# Patient Record
Sex: Male | Born: 1935 | Race: White | Hispanic: No
Health system: Southern US, Community
[De-identification: ages and names within clinical notes are randomized; demographics above are authoritative.]

## PROBLEM LIST (undated history)

## (undated) DIAGNOSIS — I10 Essential (primary) hypertension: Secondary | ICD-10-CM

## (undated) DIAGNOSIS — E119 Type 2 diabetes mellitus without complications: Secondary | ICD-10-CM

## (undated) DIAGNOSIS — E669 Obesity, unspecified: Secondary | ICD-10-CM

## (undated) DIAGNOSIS — E785 Hyperlipidemia, unspecified: Secondary | ICD-10-CM

---

## 2019-09-06 ENCOUNTER — Inpatient Hospital Stay (HOSPITAL_COMMUNITY)
Admission: EM | Admit: 2019-09-06 | Discharge: 2019-09-11 | DRG: 177 | Disposition: A | Discharge status: Home Health | Payer: Medicare HMO | Source: Other Acute Inpatient Hospital | Attending: Internal Medicine | Admitting: Internal Medicine

## 2019-09-06 ENCOUNTER — Encounter (HOSPITAL_COMMUNITY): Payer: Self-pay | Admitting: Internal Medicine

## 2019-09-06 DIAGNOSIS — I714 Abdominal aortic aneurysm, without rupture, unspecified: Secondary | ICD-10-CM | POA: Diagnosis present

## 2019-09-06 DIAGNOSIS — E1169 Type 2 diabetes mellitus with other specified complication: Secondary | ICD-10-CM | POA: Diagnosis present

## 2019-09-06 DIAGNOSIS — N179 Acute kidney failure, unspecified: Secondary | ICD-10-CM | POA: Diagnosis present

## 2019-09-06 DIAGNOSIS — K922 Gastrointestinal hemorrhage, unspecified: Secondary | ICD-10-CM | POA: Diagnosis present

## 2019-09-06 DIAGNOSIS — Z7984 Long term (current) use of oral hypoglycemic drugs: Secondary | ICD-10-CM | POA: Diagnosis not present

## 2019-09-06 DIAGNOSIS — J1282 Pneumonia due to coronavirus disease 2019: Secondary | ICD-10-CM | POA: Diagnosis present

## 2019-09-06 DIAGNOSIS — J440 Chronic obstructive pulmonary disease with acute lower respiratory infection: Secondary | ICD-10-CM | POA: Diagnosis not present

## 2019-09-06 DIAGNOSIS — D62 Acute posthemorrhagic anemia: Secondary | ICD-10-CM | POA: Diagnosis present

## 2019-09-06 DIAGNOSIS — E785 Hyperlipidemia, unspecified: Secondary | ICD-10-CM | POA: Diagnosis present

## 2019-09-06 DIAGNOSIS — I700 Atherosclerosis of aorta: Secondary | ICD-10-CM | POA: Diagnosis present

## 2019-09-06 DIAGNOSIS — I10 Essential (primary) hypertension: Secondary | ICD-10-CM | POA: Diagnosis present

## 2019-09-06 DIAGNOSIS — Z79899 Other long term (current) drug therapy: Secondary | ICD-10-CM

## 2019-09-06 DIAGNOSIS — H548 Legal blindness, as defined in USA: Secondary | ICD-10-CM | POA: Diagnosis present

## 2019-09-06 DIAGNOSIS — R45851 Suicidal ideations: Secondary | ICD-10-CM | POA: Diagnosis not present

## 2019-09-06 DIAGNOSIS — U071 COVID-19: Principal | ICD-10-CM | POA: Diagnosis present

## 2019-09-06 DIAGNOSIS — E669 Obesity, unspecified: Secondary | ICD-10-CM | POA: Diagnosis present

## 2019-09-06 DIAGNOSIS — E119 Type 2 diabetes mellitus without complications: Secondary | ICD-10-CM | POA: Diagnosis present

## 2019-09-06 DIAGNOSIS — Z7982 Long term (current) use of aspirin: Secondary | ICD-10-CM

## 2019-09-06 DIAGNOSIS — Z66 Do not resuscitate: Secondary | ICD-10-CM | POA: Diagnosis present

## 2019-09-06 DIAGNOSIS — J9601 Acute respiratory failure with hypoxia: Secondary | ICD-10-CM | POA: Diagnosis present

## 2019-09-06 DIAGNOSIS — G4700 Insomnia, unspecified: Secondary | ICD-10-CM | POA: Diagnosis present

## 2019-09-06 DIAGNOSIS — Z833 Family history of diabetes mellitus: Secondary | ICD-10-CM | POA: Diagnosis not present

## 2019-09-06 HISTORY — DX: Obesity, unspecified: E66.9

## 2019-09-06 HISTORY — DX: Hyperlipidemia, unspecified: E78.5

## 2019-09-06 HISTORY — DX: Essential (primary) hypertension: I10

## 2019-09-06 HISTORY — DX: Type 2 diabetes mellitus without complications: E11.9

## 2019-09-06 LAB — CBC WITH DIFFERENTIAL/PLATELET
Abs Immature Granulocytes: 0.04 10*3/uL (ref 0.00–0.07)
Basophils Absolute: 0 10*3/uL (ref 0.0–0.1)
Basophils Relative: 0 %
Eosinophils Absolute: 0 10*3/uL (ref 0.0–0.5)
Eosinophils Relative: 0 %
HCT: 34.2 % — ABNORMAL LOW (ref 39.0–52.0)
Hemoglobin: 11.6 g/dL — ABNORMAL LOW (ref 13.0–17.0)
Immature Granulocytes: 1 %
Lymphocytes Relative: 9 %
Lymphs Abs: 0.6 10*3/uL — ABNORMAL LOW (ref 0.7–4.0)
MCH: 32.2 pg (ref 26.0–34.0)
MCHC: 33.9 g/dL (ref 30.0–36.0)
MCV: 95 fL (ref 80.0–100.0)
Monocytes Absolute: 0.2 10*3/uL (ref 0.1–1.0)
Monocytes Relative: 4 %
Neutro Abs: 5.2 10*3/uL (ref 1.7–7.7)
Neutrophils Relative %: 86 %
Platelets: 124 10*3/uL — ABNORMAL LOW (ref 150–400)
RBC: 3.6 MIL/uL — ABNORMAL LOW (ref 4.22–5.81)
RDW: 15.4 % (ref 11.5–15.5)
WBC: 6.1 10*3/uL (ref 4.0–10.5)
nRBC: 0 % (ref 0.0–0.2)

## 2019-09-06 LAB — C-REACTIVE PROTEIN: CRP: 11.6 mg/dL — ABNORMAL HIGH (ref <1.0)

## 2019-09-06 LAB — HEMOGLOBIN AND HEMATOCRIT, BLOOD
HCT: 32.9 % — ABNORMAL LOW (ref 39.0–52.0)
Hemoglobin: 11.4 g/dL — ABNORMAL LOW (ref 13.0–17.0)

## 2019-09-06 LAB — COMPREHENSIVE METABOLIC PANEL
ALT: 25 U/L (ref 0–44)
AST: 67 U/L — ABNORMAL HIGH (ref 15–41)
Albumin: 2.3 g/dL — ABNORMAL LOW (ref 3.5–5.0)
Alkaline Phosphatase: 49 U/L (ref 38–126)
Anion gap: 12 (ref 5–15)
BUN: 33 mg/dL — ABNORMAL HIGH (ref 8–23)
CO2: 19 mmol/L — ABNORMAL LOW (ref 22–32)
Calcium: 7.4 mg/dL — ABNORMAL LOW (ref 8.9–10.3)
Chloride: 106 mmol/L (ref 98–111)
Creatinine, Ser: 1.65 mg/dL — ABNORMAL HIGH (ref 0.61–1.24)
GFR calc Af Amer: 44 mL/min — ABNORMAL LOW (ref >60)
GFR calc non Af Amer: 38 mL/min — ABNORMAL LOW (ref >60)
Glucose, Bld: 140 mg/dL — ABNORMAL HIGH (ref 70–99)
Potassium: 3.3 mmol/L — ABNORMAL LOW (ref 3.5–5.1)
Sodium: 137 mmol/L (ref 135–145)
Total Bilirubin: 0.6 mg/dL (ref 0.3–1.2)
Total Protein: 5.5 g/dL — ABNORMAL LOW (ref 6.5–8.1)

## 2019-09-06 LAB — FERRITIN: Ferritin: 1347 ng/mL — ABNORMAL HIGH (ref 24–336)

## 2019-09-06 LAB — TYPE AND SCREEN
ABO/RH(D): A NEG
Antibody Screen: NEGATIVE

## 2019-09-06 LAB — ABO/RH: ABO/RH(D): A NEG

## 2019-09-06 LAB — MAGNESIUM: Magnesium: 1.1 mg/dL — ABNORMAL LOW (ref 1.7–2.4)

## 2019-09-06 LAB — PHOSPHORUS: Phosphorus: 2.7 mg/dL (ref 2.5–4.6)

## 2019-09-06 LAB — GLUCOSE, CAPILLARY
Glucose-Capillary: 177 mg/dL — ABNORMAL HIGH (ref 70–99)
Glucose-Capillary: 165 mg/dL — ABNORMAL HIGH (ref 70–99)

## 2019-09-06 LAB — D-DIMER, QUANTITATIVE: D-Dimer, Quant: 4.15 ug/mL-FEU — ABNORMAL HIGH (ref 0.00–0.50)

## 2019-09-06 MED ORDER — CHLORHEXIDINE GLUCONATE CLOTH 2 % EX PADS
6.0000 | MEDICATED_PAD | Freq: Every day | CUTANEOUS | Status: DC
  Administered 2019-09-07 – 2019-09-11 (×5): 6 via TOPICAL

## 2019-09-06 MED ORDER — SODIUM CHLORIDE 0.9 % IV SOLN
100.0000 mg | Freq: Every day | INTRAVENOUS | Status: AC
  Administered 2019-09-06 – 2019-09-09 (×4): 100 mg via INTRAVENOUS
  Filled 2019-09-06 (×4): qty 20

## 2019-09-06 MED ORDER — PANTOPRAZOLE SODIUM 40 MG IV SOLR
40.0000 mg | Freq: Two times a day (BID) | INTRAVENOUS | Status: DC
  Filled 2019-09-06: qty 40

## 2019-09-06 MED ORDER — INSULIN ASPART 100 UNIT/ML ~~LOC~~ SOLN
0.0000 [IU] | Freq: Three times a day (TID) | SUBCUTANEOUS | Status: DC
  Administered 2019-09-06: 2 [IU] via SUBCUTANEOUS
  Administered 2019-09-07: 1 [IU] via SUBCUTANEOUS
  Administered 2019-09-07 – 2019-09-08 (×3): 2 [IU] via SUBCUTANEOUS
  Administered 2019-09-08: 1 [IU] via SUBCUTANEOUS
  Administered 2019-09-08: 2 [IU] via SUBCUTANEOUS
  Administered 2019-09-09: 1 [IU] via SUBCUTANEOUS
  Administered 2019-09-09: 2 [IU] via SUBCUTANEOUS
  Administered 2019-09-09: 1 [IU] via SUBCUTANEOUS
  Administered 2019-09-10: 2 [IU] via SUBCUTANEOUS
  Administered 2019-09-11: 1 [IU] via SUBCUTANEOUS

## 2019-09-06 MED ORDER — ATORVASTATIN CALCIUM 80 MG PO TABS
80.0000 mg | ORAL_TABLET | Freq: Every day | ORAL | Status: DC
  Administered 2019-09-06 – 2019-09-10 (×5): 80 mg via ORAL
  Filled 2019-09-06 (×5): qty 1

## 2019-09-06 MED ORDER — FLUTICASONE FUROATE-VILANTEROL 100-25 MCG/INH IN AEPB
1.0000 | INHALATION_SPRAY | Freq: Every day | RESPIRATORY_TRACT | Status: DC
  Filled 2019-09-06: qty 28

## 2019-09-06 MED ORDER — FLUTICASONE-UMECLIDIN-VILANT 100-62.5-25 MCG/INH IN AEPB: 1.0000 | INHALATION_SPRAY | Freq: Every day | RESPIRATORY_TRACT | Status: DC

## 2019-09-06 MED ORDER — ACETAMINOPHEN 325 MG PO TABS
650.0000 mg | ORAL_TABLET | Freq: Four times a day (QID) | ORAL | Status: DC | PRN
  Administered 2019-09-08 (×2): 650 mg via ORAL
  Filled 2019-09-06 (×2): qty 2

## 2019-09-06 MED ORDER — SODIUM CHLORIDE 0.9 % IV SOLN
8.0000 mg/h | INTRAVENOUS | Status: AC
  Administered 2019-09-06 – 2019-09-09 (×7): 8 mg/h via INTRAVENOUS
  Filled 2019-09-06 (×8): qty 80

## 2019-09-06 MED ORDER — HYDROCOD POLST-CPM POLST ER 10-8 MG/5ML PO SUER
5.0000 mL | Freq: Two times a day (BID) | ORAL | Status: DC | PRN
  Administered 2019-09-08: 5 mL via ORAL
  Filled 2019-09-06: qty 5

## 2019-09-06 MED ORDER — ONDANSETRON HCL 4 MG PO TABS: 4.0000 mg | ORAL_TABLET | Freq: Four times a day (QID) | ORAL | Status: DC | PRN

## 2019-09-06 MED ORDER — DEXAMETHASONE 6 MG PO TABS
6.0000 mg | ORAL_TABLET | ORAL | Status: DC
  Administered 2019-09-06 – 2019-09-11 (×6): 6 mg via ORAL
  Filled 2019-09-06 (×7): qty 1

## 2019-09-06 MED ORDER — CALCIUM GLUCONATE-NACL 2-0.675 GM/100ML-% IV SOLN
2.0000 g | Freq: Once | INTRAVENOUS | Status: AC
  Administered 2019-09-06: 2000 mg via INTRAVENOUS
  Filled 2019-09-06: qty 100

## 2019-09-06 MED ORDER — GUAIFENESIN-DM 100-10 MG/5ML PO SYRP
10.0000 mL | ORAL_SOLUTION | ORAL | Status: DC | PRN
  Administered 2019-09-11: 10 mL via ORAL
  Filled 2019-09-06: qty 10

## 2019-09-06 MED ORDER — INSULIN ASPART 100 UNIT/ML ~~LOC~~ SOLN: 0.0000 [IU] | Freq: Every day | SUBCUTANEOUS | Status: DC

## 2019-09-06 MED ORDER — FLUTICASONE FUROATE-VILANTEROL 100-25 MCG/INH IN AEPB
1.0000 | INHALATION_SPRAY | Freq: Every day | RESPIRATORY_TRACT | Status: DC
  Administered 2019-09-06 – 2019-09-11 (×6): 1 via RESPIRATORY_TRACT
  Filled 2019-09-06 (×2): qty 28

## 2019-09-06 MED ORDER — SODIUM CHLORIDE 0.9 % IV SOLN: INTRAVENOUS | Status: DC

## 2019-09-06 MED ORDER — ALBUTEROL SULFATE HFA 108 (90 BASE) MCG/ACT IN AERS
2.0000 | INHALATION_SPRAY | Freq: Four times a day (QID) | RESPIRATORY_TRACT | Status: DC
  Administered 2019-09-06 – 2019-09-11 (×20): 2 via RESPIRATORY_TRACT
  Filled 2019-09-06: qty 6.7

## 2019-09-06 MED ORDER — ZINC SULFATE 220 (50 ZN) MG PO CAPS
220.0000 mg | ORAL_CAPSULE | Freq: Every day | ORAL | Status: DC
  Administered 2019-09-06 – 2019-09-11 (×6): 220 mg via ORAL
  Filled 2019-09-06 (×6): qty 1

## 2019-09-06 MED ORDER — ASCORBIC ACID 500 MG PO TABS
500.0000 mg | ORAL_TABLET | Freq: Every day | ORAL | Status: DC
  Administered 2019-09-06 – 2019-09-11 (×6): 500 mg via ORAL
  Filled 2019-09-06 (×6): qty 1

## 2019-09-06 MED ORDER — SODIUM CHLORIDE 0.9% FLUSH
3.0000 mL | Freq: Two times a day (BID) | INTRAVENOUS | Status: DC
  Administered 2019-09-06 – 2019-09-09 (×3): 3 mL via INTRAVENOUS

## 2019-09-06 MED ORDER — UMECLIDINIUM BROMIDE 62.5 MCG/INH IN AEPB
1.0000 | INHALATION_SPRAY | Freq: Every day | RESPIRATORY_TRACT | Status: DC
  Administered 2019-09-06 – 2019-09-11 (×6): 1 via RESPIRATORY_TRACT
  Filled 2019-09-06: qty 7

## 2019-09-06 MED ORDER — ONDANSETRON HCL 4 MG/2ML IJ SOLN: 4.0000 mg | Freq: Four times a day (QID) | INTRAMUSCULAR | Status: DC | PRN

## 2019-09-06 MED ORDER — UMECLIDINIUM BROMIDE 62.5 MCG/INH IN AEPB
1.0000 | INHALATION_SPRAY | Freq: Every day | RESPIRATORY_TRACT | Status: DC
  Filled 2019-09-06: qty 7

## 2019-09-06 NOTE — Progress Notes (Signed)
Pt was transferred from [place] for COVID.   He received remdesivir 200mg  at 2205 on 4/7 along with dexamethasone. His PCT 0.81 and he didn't get abx there. No Actemra was given  6/7, PharmD, BCIDP, AAHIVP, CPP Infectious Disease Pharmacist 09/06/2019 11:06 AM

## 2019-09-06 NOTE — Progress Notes (Signed)
Julian Golden is a 84 y.o. male patient admitted from [place] [facility]  ED awake, alert - oriented  X 4 - no acute distress noted.  VSS - Blood pressure 109/70, temperature 97.8 F (36.6 C), temperature source Oral, resp. rate 20. SpO2 97% on 2L of oxygen,   IV in place, occlusive dsg intact without redness.  Orientation to room, and floor completed with information packet given to patient/family.  Patient declined safety video at this time.  Admission INP armband ID verified with patient/family, and in place.   SR up x 2, fall assessment complete, with patient and family able to verbalize understanding of risk associated with falls, and verbalized understanding to call nsg before up out of bed.  Call light within reach, patient able to voice, and demonstrate understanding. Pt has MASD on groin and bilateral axilla, No other  evidence of skin break down noted on exam.  [facility] admissions notified of pts arrival.    Will cont to eval and treat per MD orders.  Eligah East, RN 09/06/2019 11:18 AM

## 2019-09-06 NOTE — H&P (Signed)
History and Physical    Brown Dunlap KYH:[id number] DOB: 1935/08/11 DOA: 09/06/2019  Referring MD/NP/PA: Mitzi Hansen, MD PCP: Algis Greenhouse, MD  Patient coming from: Transfer points of bleeding [facility]  Chief Complaint: Weakness I have personally briefly reviewed patient's old medical records in [place]   HPI: Julian Golden is a 84 y.o. male without reported significant past medical history hypertension, hyperlipidemia, and diabetes mellitus type 2 presented initially to [facility] on 4/7 with reports of weakness.  History is obtained mostly from review of records and in talks with the patient.  In triage patient had portably syncopized and was pale and unresponsive.  His daughter reported that he had said that he was ready to die for the last 2 years.  He was reported to not be on any blood thinners, but have reportedly been having black diarrhea for the last 5 days.  The patient was noted to be afebrile, pulse 123, respirations 44, blood pressure 93/50, and O2 saturation 89% on room air.  He was placed on 2 L of cannula oxygen to maintain O2 saturations.  Labs revealed WBC 10.7, hemoglobin 14.9, platelets 202, BUN 43, Cr 2.1, Lactic acid 5.9, procalcitonin 0.81, CRP 79. CXR revealed a multifocal pna. Covid pcr was positive. 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  CT scan of thee chest, abdomen, and pelvis revealed patchy bilateral opacities in the mid and lower lungs, aortic atherosclerosis with a 4.4 cm infrarenal renal abdominal aortic aneurysm.  He has been treated with IV fluids, Protonix drip, Decadron, remdesivir.  Repeat labs on 4/8 revealed hemoglobin dropped down to 11.6, BUN 36, creatinine 1.6, and lactic acid was clearing.  Transfer had been requested for the possible need of GI consultative services. Patient denies any use of any NSAIDs because it messes up his stomach. He does take a daily  aspirin.    ED Course: As seen above  Review of Systems  Respiratory: Positive for cough and shortness of breath.     Past Medical History:  Diagnosis Date  . DM type 2 (diabetes mellitus, type 2) ([place])   . HTN (hypertension)   . Hyperlipemia     History reviewed. No pertinent surgical history.   has no history on file for tobacco, alcohol, and drug.  No Known Allergies  Family history significant for diabetes.  Prior to Admission medications   Not on File    Physical Exam:  Constitutional: Obese elderly male who appears acutely ill Vitals:   09/06/19 1005  BP: 109/70  Resp: (!) 28  Temp: 97.8 F (36.6 C)  TempSrc: Oral   Eyes: PERRL, lids and conjunctivae normal ENMT: Mucous membranes are dry. Posterior pharynx clear of any exudate or lesions.   Neck: normal, supple, no masses, no thyromegaly Respiratory: Tachypneic with decreased aeration currently on 2 L nasal cannula oxygen. Cardiovascular: Regular rate and rhythm, no murmurs / rubs / gallops. No extremity edema. 2+ pedal pulses. No carotid bruits.  Abdomen: no tenderness, no masses palpated. No hepatosplenomegaly. Bowel sounds positive.  Musculoskeletal: no clubbing / cyanosis. No joint deformity upper and lower extremities. Good ROM, no contractures. Normal muscle tone.  Skin: no rashes, lesions, ulcers. No induration Neurologic: CN 2-12 grossly intact. Sensation intact, DTR normal. Strength 5/5 in all 4.  Psychiatric:   Alert and oriented x person . Normal mood.     Labs on Admission: I have personally reviewed following labs and imaging  studies  CBC: No results for input(s): WBC, NEUTROABS, HGB, HCT, MCV, PLT in the last 168 hours. Basic Metabolic Panel: No results for input(s): NA, K, CL, CO2, GLUCOSE, BUN, CREATININE, CALCIUM, MG, PHOS in the last 168 hours. GFR: CrCl cannot be calculated (No successful lab value found.). Liver Function Tests: No results for input(s): AST, ALT, ALKPHOS, BILITOT,  PROT, ALBUMIN in the last 168 hours. No results for input(s): LIPASE, AMYLASE in the last 168 hours. No results for input(s): AMMONIA in the last 168 hours. Coagulation Profile: No results for input(s): INR, PROTIME in the last 168 hours. Cardiac Enzymes: No results for input(s): CKTOTAL, CKMB, CKMBINDEX, TROPONINI in the last 168 hours. BNP (last 3 results) No results for input(s): PROBNP in the last 8760 hours. HbA1C: No results for input(s): HGBA1C in the last 72 hours. CBG: No results for input(s): GLUCAP in the last 168 hours. Lipid Profile: No results for input(s): CHOL, HDL, LDLCALC, TRIG, CHOLHDL, LDLDIRECT in the last 72 hours. Thyroid Function Tests: No results for input(s): TSH, T4TOTAL, FREET4, T3FREE, THYROIDAB in the last 72 hours. Anemia Panel: No results for input(s): VITAMINB12, FOLATE, FERRITIN, TIBC, IRON, RETICCTPCT in the last 72 hours. Urine analysis: No results found for: COLORURINE, APPEARANCEUR, LABSPEC, PHURINE, GLUCOSEU, HGBUR, BILIRUBINUR, KETONESUR, PROTEINUR, UROBILINOGEN, NITRITE, LEUKOCYTESUR Sepsis Labs: No results found for this or any previous visit (from the past 240 hour(s)).   Radiological Exams on Admission: No results found.  OutsideEKG: Independently reviewed.  Sinus rhythm with PVCs and complexes.  Assessment/Plan GI bleed, Acute blood loss anemia: Patient presents with complaints of dark stools and weakness.  Stool guaiacs were noted to be positive at the outside facility with initial hemoglobin 14.9->11.6.  The elevated BUN  suggest possible upper GI bleed. -Admit to a telemetry bed  -Type and screen -Continue Protonix gtt -Continue IV fluids of normal saline at 75 mL/h -Formally consult GI if acutely bleeding. Otherwise follow-up in outpatient setting with [facility].  Acute respiratory failure with hypoxia secondary to pneumonia due to COVID-19: Patient presented with complaints of weakness and dark stools. -COVID-19 order set  utilized -Continuous pulse oximetry with nasal cannula oxygen to maintain O2 saturation greater than 90% -Albuterol inhaler -Decadron 6 mg daily -Continue remdesivir per pharmacy -Vitamin C and zinc -Antitussives as needed -Continue monitoring laboratory markers daily  Acute kidney injury: Patient baseline creatinine unknown.  At the outside facility patient's creatinine was noted to be 1.9 with BUN 40.  No prior labs with which to compare. -IV fluids at 75 mL/h -Continue to monitor kidney function daily -Hold diuretic  ?COPD -Continue trilogy Elliptia  Diabetes mellitus type 2: Home medications include Metformin 1000 mg twice daily. -Hypoglycemic protocol -Hold Metformin  -CBGs before every meal with sensitive SSI  Essential hypertension: Patient had initially been noted to be hypotensive.  Blood pressures currently maintained.  Home blood pressure medications include irbesartan-hydrochlorothiazide 300-12.5mg  daily and spironolactone 25 mg daily. -Hold blood pressure medications  AAA: Patient noted to have a 4.4 infrarenal abdominal aortic aneurysm on CT imaging at outside facility. -Recommending outpatient follow-up with repeat scan in 1 year  DNR: Present on admission   DVT prophylaxis: SCDs Code Status: DNR Family Communication: Family not able to be reached at this time Disposition Plan: To be determined Consults called: Discussed case with GI Admission status:Inpatient   Clydie Braun MD [facility] Pager [phone]   If 7PM-7AM, please contact night-coverage [email or web address] Password [facility]  09/06/2019, 10:32 AM

## 2019-09-07 ENCOUNTER — Inpatient Hospital Stay (HOSPITAL_COMMUNITY): Payer: Medicare HMO

## 2019-09-07 DIAGNOSIS — N179 Acute kidney failure, unspecified: Secondary | ICD-10-CM | POA: Diagnosis not present

## 2019-09-07 DIAGNOSIS — K922 Gastrointestinal hemorrhage, unspecified: Secondary | ICD-10-CM | POA: Diagnosis not present

## 2019-09-07 DIAGNOSIS — D62 Acute posthemorrhagic anemia: Secondary | ICD-10-CM | POA: Diagnosis not present

## 2019-09-07 DIAGNOSIS — I714 Abdominal aortic aneurysm, without rupture: Secondary | ICD-10-CM | POA: Diagnosis not present

## 2019-09-07 LAB — COMPREHENSIVE METABOLIC PANEL
ALT: 24 U/L (ref 0–44)
AST: 58 U/L — ABNORMAL HIGH (ref 15–41)
Albumin: 2.1 g/dL — ABNORMAL LOW (ref 3.5–5.0)
Alkaline Phosphatase: 49 U/L (ref 38–126)
Anion gap: 12 (ref 5–15)
BUN: 35 mg/dL — ABNORMAL HIGH (ref 8–23)
CO2: 20 mmol/L — ABNORMAL LOW (ref 22–32)
Calcium: 7.8 mg/dL — ABNORMAL LOW (ref 8.9–10.3)
Chloride: 107 mmol/L (ref 98–111)
Creatinine, Ser: 1.37 mg/dL — ABNORMAL HIGH (ref 0.61–1.24)
GFR calc Af Amer: 56 mL/min — ABNORMAL LOW (ref >60)
GFR calc non Af Amer: 48 mL/min — ABNORMAL LOW (ref >60)
Glucose, Bld: 170 mg/dL — ABNORMAL HIGH (ref 70–99)
Potassium: 3.6 mmol/L (ref 3.5–5.1)
Sodium: 139 mmol/L (ref 135–145)
Total Bilirubin: 0.7 mg/dL (ref 0.3–1.2)
Total Protein: 5.1 g/dL — ABNORMAL LOW (ref 6.5–8.1)

## 2019-09-07 LAB — GLUCOSE, CAPILLARY
Glucose-Capillary: 160 mg/dL — ABNORMAL HIGH (ref 70–99)
Glucose-Capillary: 156 mg/dL — ABNORMAL HIGH (ref 70–99)
Glucose-Capillary: 150 mg/dL — ABNORMAL HIGH (ref 70–99)
Glucose-Capillary: 171 mg/dL — ABNORMAL HIGH (ref 70–99)

## 2019-09-07 LAB — CBC WITH DIFFERENTIAL/PLATELET
Abs Immature Granulocytes: 0.03 10*3/uL (ref 0.00–0.07)
Basophils Absolute: 0 10*3/uL (ref 0.0–0.1)
Basophils Relative: 0 %
Eosinophils Absolute: 0 10*3/uL (ref 0.0–0.5)
Eosinophils Relative: 0 %
HCT: 33.2 % — ABNORMAL LOW (ref 39.0–52.0)
Hemoglobin: 11.4 g/dL — ABNORMAL LOW (ref 13.0–17.0)
Immature Granulocytes: 1 %
Lymphocytes Relative: 8 %
Lymphs Abs: 0.5 10*3/uL — ABNORMAL LOW (ref 0.7–4.0)
MCH: 32 pg (ref 26.0–34.0)
MCHC: 34.3 g/dL (ref 30.0–36.0)
MCV: 93.3 fL (ref 80.0–100.0)
Monocytes Absolute: 0.4 10*3/uL (ref 0.1–1.0)
Monocytes Relative: 6 %
Neutro Abs: 5.6 10*3/uL (ref 1.7–7.7)
Neutrophils Relative %: 85 %
Platelets: 127 10*3/uL — ABNORMAL LOW (ref 150–400)
RBC: 3.56 MIL/uL — ABNORMAL LOW (ref 4.22–5.81)
RDW: 15 % (ref 11.5–15.5)
WBC: 6.5 10*3/uL (ref 4.0–10.5)
nRBC: 0 % (ref 0.0–0.2)

## 2019-09-07 LAB — PHOSPHORUS: Phosphorus: 2.6 mg/dL (ref 2.5–4.6)

## 2019-09-07 LAB — HEMOGLOBIN A1C
Hgb A1c MFr Bld: 6.9 % — ABNORMAL HIGH (ref 4.8–5.6)
Mean Plasma Glucose: 151 mg/dL

## 2019-09-07 LAB — FERRITIN: Ferritin: 1082 ng/mL — ABNORMAL HIGH (ref 24–336)

## 2019-09-07 LAB — MAGNESIUM: Magnesium: 1.2 mg/dL — ABNORMAL LOW (ref 1.7–2.4)

## 2019-09-07 LAB — C-REACTIVE PROTEIN: CRP: 7.8 mg/dL — ABNORMAL HIGH (ref <1.0)

## 2019-09-07 LAB — D-DIMER, QUANTITATIVE: D-Dimer, Quant: 3.17 ug/mL-FEU — ABNORMAL HIGH (ref 0.00–0.50)

## 2019-09-07 MED ORDER — SODIUM CHLORIDE 0.9% FLUSH: 10.0000 mL | INTRAVENOUS | Status: DC | PRN

## 2019-09-07 MED ORDER — SODIUM CHLORIDE 0.9% FLUSH
10.0000 mL | Freq: Two times a day (BID) | INTRAVENOUS | Status: DC
  Administered 2019-09-09 – 2019-09-11 (×3): 10 mL

## 2019-09-07 MED ORDER — SODIUM CHLORIDE 0.9 % IV SOLN
1.0000 g | INTRAVENOUS | Status: DC
  Administered 2019-09-07 – 2019-09-10 (×4): 1 g via INTRAVENOUS
  Filled 2019-09-07 (×4): qty 10

## 2019-09-07 MED ORDER — SODIUM CHLORIDE 0.9 % IV SOLN
500.0000 mg | INTRAVENOUS | Status: DC
  Administered 2019-09-07 – 2019-09-10 (×4): 500 mg via INTRAVENOUS
  Filled 2019-09-07 (×5): qty 500

## 2019-09-07 MED ORDER — LOPERAMIDE HCL 2 MG PO CAPS
2.0000 mg | ORAL_CAPSULE | ORAL | Status: DC | PRN
  Administered 2019-09-07: 2 mg via ORAL
  Filled 2019-09-07: qty 1

## 2019-09-07 NOTE — Progress Notes (Signed)
PROGRESS NOTE                                                                                                                                                                                                             Patient Demographics:    Mayjor Ager, is a 84 y.o. male, DOB - [birth year], JXB:[id number]  Admit date - 09/06/2019   Admitting Physician Norval Morton, MD  Outpatient Primary MD for the patient is Dough, Jaymes Graff, MD  LOS - 1   No chief complaint on file.      Brief Narrative    Abayomi Pattison is a 84 y.o. male without reported significant past medical history hypertension, hyperlipidemia, and diabetes mellitus type 2 presented initially to [facility] on 4/7 with reports of weakness.   He was reported to not be on any blood thinners, but have reportedly been having black diarrhea for the last 5 days.  The patient was noted to be afebrile, pulse 123, respirations 44, blood pressure 93/50, and O2 saturation 89% on room air.  He was placed on 2 L of cannula oxygen to maintain O2 saturations.  Labs revealed WBC 10.7, hemoglobin 14.9, platelets 202, BUN 43, Cr 2.1, Lactic acid 5.9, procalcitonin 0.81, CRP 79. CXR revealed a multifocal pna. Covid pcr was positive. 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  CT scan of thee chest, abdomen, and pelvis revealed patchy bilateral opacities in the mid and lower lungs, aortic atherosclerosis with a 4.4 cm infrarenal renal abdominal aortic aneurysm.  He has been treated with IV fluids, Protonix drip, Decadron, remdesivir.  Repeat labs on 4/8 revealed hemoglobin dropped down to 11.6, BUN 36, creatinine 1.6, and lactic acid was clearing.  Transfer had been requested for the possible need of GI consultative services. Patient denies any use of any NSAIDs because it messes up his stomach. He does take a daily aspirin.      Subjective:     Zechariah Bissonnette today denies any dyspnea, reports generalized weakness, some cough, he had a bowel movement today, which was quit, but it was irregular brown in color .   Assessment  & Plan :    Principal Problem:   GI bleed Active Problems:   Pneumonia due to COVID-19 virus   AKI (acute kidney  injury) [facility])   Essential hypertension   Diabetes mellitus type 2 in obese (HCC)   AAA (abdominal aortic aneurysm) (HCC)   DNR (do not resuscitate)   Acute blood loss anemia   GI bleed, Acute blood loss anemia:  - Patient presents with complaints of dark stools and weakness.  Stool guaiacs were noted to be positive at the outside facility with initial hemoglobin 14.9->11.6.  The elevated BUN  suggest possible upper GI bleed. -Admitting physician discussed with GI, given stable hemoglobin is COVID-19 status, no work-up is currently indicated, otherwise work-up can be done as an outpatient. -This is most likely upper GI bleed, continue with Protonix drip. -Hemoglobin has been stable, continue to monitor closely, no indication for transfusion. -Advance diet as tolerated. -Did not follow with GI physician in the past, never had endoscopy or colonoscopy, I have discussed with daughter importance of outpatient GI follow-up especially for screening colonoscopy.  Acute respiratory failure with hypoxia secondary to pneumonia due to COVID-19: -Chest x-ray significant for multifocal opacity. -Morning he is on 2 L nasal cannula, but appears to be saturation 96%, discussed with staff to try to wean off oxygen. -Courage to use incentive spirometry and flutter valve, and to get out of bed to chair. -Continue with steroids. -Continue with IV remdesivir -So far no indication for Actemra. -Elevated procalcitonin 0.8 at [facility] ED, will start on IV Rocephin and azithromycin, especially with elevated lactic acid 5.9. -Markers closely especially they are elevated on admission.   COVID-19 Labs  Recent Labs     09/06/19 1055 09/07/19 0344  DDIMER 4.15* 3.17*  FERRITIN 1,347* 1,082*  CRP 11.6* 7.8*    No results found for: SARSCOV2NAA  Acute kidney injury:  - Patient baseline creatinine unknown.  At the outside facility patient's creatinine was noted to be 1.9 with BUN 40. -Improving with IV fluids, hold diuresis and nephrotoxic medications.  ?COPD -Continue trilogy Elliptia  Diabetes mellitus type 2: Home medications include Metformin 1000 mg twice daily. -Hypoglycemic protocol -Hold Metformin  -CBGs before every meal with sensitive SSI  Essential hypertension:  - Patient had initially been noted to be hypotensive.  Blood pressures currently maintained.  Home blood pressure medications include irbesartan-hydrochlorothiazide 300-12.5mg  daily and spironolactone 25 mg daily. -Hold blood pressure medications  AAA: Patient noted to have a 4.4 infrarenal abdominal aortic aneurysm on CT imaging at outside facility. -Recommending outpatient follow-up with rep  Code Status : DNR  Family Communication  : D/W daughter  Disposition Plan  : Pending PT.  Barriers For Discharge : on IV remdesivir, monitored for GI bleed  Consults  :  None  Procedures  : None  DVT Prophylaxis  :  SCD  Lab Results  Component Value Date   PLT 127 (L) 09/07/2019    Antibiotics  :    Anti-infectives (From admission, onward)   Start     Dose/Rate Route Frequency Ordered Stop   09/06/19 1600  remdesivir 100 mg in sodium chloride 0.9 % 100 mL IVPB     100 mg 200 mL/hr over 30 Minutes Intravenous Daily 09/06/19 1104 09/10/19 0959        Objective:   Vitals:   09/06/19 1713 09/06/19 1932 09/07/19 0421 09/07/19 0735  BP: 137/78 110/61 120/68 114/70  Pulse: 99 80 86 74  Resp: (!) 25 (!) 23  19  Temp: 98.1 F (36.7 C) 98.4 F (36.9 C) 97.9 F (36.6 C) 98 F (36.7 C)  TempSrc: Oral Oral  Oral  SpO2:  97% 95% 96% 100%    Wt Readings from Last 3 Encounters:  No data found for Wt      Intake/Output Summary (Last 24 hours) at 09/07/2019 1243 Last data filed at 09/07/2019 0423 Gross per 24 hour  Intake 1697.71 ml  Output 450 ml  Net 1247.71 ml     Physical Exam  Awake Alert, Oriented X 3, No new F.N deficits, Normal affect Patient is legally blind..  Symmetrical Chest wall movement, Good air movement bilaterally, CTAB RRR,No Gallops,Rubs or new Murmurs, No Parasternal Heave +ve B.Sounds, Abd Soft, No tenderness,  No rebound - guarding or rigidity. No Cyanosis, Clubbing or edema, No new Rash or bruise     Data Review:    CBC Recent Labs  Lab 09/06/19 1055 09/06/19 2228 09/07/19 0344  WBC 6.1  --  6.5  HGB 11.6* 11.4* 11.4*  HCT 34.2* 32.9* 33.2*  PLT 124*  --  127*  MCV 95.0  --  93.3  MCH 32.2  --  32.0  MCHC 33.9  --  34.3  RDW 15.4  --  15.0  LYMPHSABS 0.6*  --  0.5*  MONOABS 0.2  --  0.4  EOSABS 0.0  --  0.0  BASOSABS 0.0  --  0.0    Chemistries  Recent Labs  Lab 09/06/19 1055 09/07/19 0344  NA 137 139  K 3.3* 3.6  CL 106 107  CO2 19* 20*  GLUCOSE 140* 170*  BUN 33* 35*  CREATININE 1.65* 1.37*  CALCIUM 7.4* 7.8*  MG 1.1* 1.2*  AST 67* 58*  ALT 25 24  ALKPHOS 49 49  BILITOT 0.6 0.7   ------------------------------------------------------------------------------------------------------------------ No results for input(s): CHOL, HDL, LDLCALC, TRIG, CHOLHDL, LDLDIRECT in the last 72 hours.  Lab Results  Component Value Date   HGBA1C 6.9 (H) 09/06/2019   ------------------------------------------------------------------------------------------------------------------ No results for input(s): TSH, T4TOTAL, T3FREE, THYROIDAB in the last 72 hours.  Invalid input(s): FREET3 ------------------------------------------------------------------------------------------------------------------ Recent Labs    09/06/19 1055 09/07/19 0344  FERRITIN 1,347* 1,082*    Coagulation profile No results for input(s): INR, PROTIME in the  last 168 hours.  Recent Labs    09/06/19 1055 09/07/19 0344  DDIMER 4.15* 3.17*    Cardiac Enzymes No results for input(s): CKMB, TROPONINI, MYOGLOBIN in the last 168 hours.  Invalid input(s): CK ------------------------------------------------------------------------------------------------------------------ No results found for: BNP  Inpatient Medications  Scheduled Meds: . albuterol  2 puff Inhalation Q6H  . vitamin C  500 mg Oral Daily  . atorvastatin  80 mg Oral Daily  . Chlorhexidine Gluconate Cloth  6 each Topical Daily  . dexamethasone  6 mg Oral Q24H  . fluticasone furoate-vilanterol  1 puff Inhalation Daily   And  . umeclidinium bromide  1 puff Inhalation Daily  . insulin aspart  0-5 Units Subcutaneous QHS  . insulin aspart  0-9 Units Subcutaneous TID WC  . [START ON 09/09/2019] pantoprazole  40 mg Intravenous Q12H  . sodium chloride flush  3 mL Intravenous Q12H  . zinc sulfate  220 mg Oral Daily   Continuous Infusions: . sodium chloride 75 mL/hr at 09/06/19 2342  . pantoprozole (PROTONIX) infusion 8 mg/hr (09/07/19 0512)  . remdesivir 100 mg in NS 100 mL 100 mg (09/07/19 0922)   PRN Meds:.acetaminophen, chlorpheniramine-HYDROcodone, guaiFENesin-dextromethorphan, ondansetron **OR** ondansetron (ZOFRAN) IV  Micro Results No results found for this or any previous visit (from the past 240 hour(s)).  Radiology Reports DG Chest Port 1 View  Result Date: 09/07/2019 CLINICAL DATA:  COVID-19 positive EXAM: PORTABLE CHEST 1 VIEW COMPARISON:  September 05, 2019 FINDINGS: There is ill-defined opacity in the left mid lung and in each lower lung region with small left pleural effusion. No consolidation. There is cardiomegaly with pulmonary vascularity normal. No adenopathy. There is aortic atherosclerosis. No bone lesions. IMPRESSION: Ill-defined opacity in the left mid lung and both lung bases consistent with multifocal pneumonia. Overall increase in opacity compared to 2 days  prior. Small left pleural effusion. Stable cardiac prominence. No adenopathy demonstrable. Aortic Atherosclerosis (ICD10-I70.0). Electronically Signed   By: Bretta Bang III M.D.   On: 09/07/2019 09:51     Huey Bienenstock M.D on 09/07/2019 at 12:43 PM  Between 7am to 7pm - Pager - [phone]  After 7pm go to [email or web address] - password [facility]  [facility] -  Office  [phone]

## 2019-09-08 DIAGNOSIS — I714 Abdominal aortic aneurysm, without rupture: Secondary | ICD-10-CM | POA: Diagnosis not present

## 2019-09-08 DIAGNOSIS — D62 Acute posthemorrhagic anemia: Secondary | ICD-10-CM | POA: Diagnosis not present

## 2019-09-08 DIAGNOSIS — K922 Gastrointestinal hemorrhage, unspecified: Secondary | ICD-10-CM | POA: Diagnosis not present

## 2019-09-08 DIAGNOSIS — U071 COVID-19: Secondary | ICD-10-CM | POA: Diagnosis not present

## 2019-09-08 LAB — CBC WITH DIFFERENTIAL/PLATELET
Abs Immature Granulocytes: 0.03 10*3/uL (ref 0.00–0.07)
Basophils Absolute: 0 10*3/uL (ref 0.0–0.1)
Basophils Relative: 0 %
Eosinophils Absolute: 0 10*3/uL (ref 0.0–0.5)
Eosinophils Relative: 0 %
HCT: 33.3 % — ABNORMAL LOW (ref 39.0–52.0)
Hemoglobin: 11.2 g/dL — ABNORMAL LOW (ref 13.0–17.0)
Immature Granulocytes: 1 %
Lymphocytes Relative: 9 %
Lymphs Abs: 0.5 10*3/uL — ABNORMAL LOW (ref 0.7–4.0)
MCH: 32 pg (ref 26.0–34.0)
MCHC: 33.6 g/dL (ref 30.0–36.0)
MCV: 95.1 fL (ref 80.0–100.0)
Monocytes Absolute: 0.4 10*3/uL (ref 0.1–1.0)
Monocytes Relative: 7 %
Neutro Abs: 4.5 10*3/uL (ref 1.7–7.7)
Neutrophils Relative %: 83 %
Platelets: 122 10*3/uL — ABNORMAL LOW (ref 150–400)
RBC: 3.5 MIL/uL — ABNORMAL LOW (ref 4.22–5.81)
RDW: 15.3 % (ref 11.5–15.5)
WBC: 5.4 10*3/uL (ref 4.0–10.5)
nRBC: 0 % (ref 0.0–0.2)

## 2019-09-08 LAB — COMPREHENSIVE METABOLIC PANEL
ALT: 26 U/L (ref 0–44)
AST: 46 U/L — ABNORMAL HIGH (ref 15–41)
Albumin: 2.1 g/dL — ABNORMAL LOW (ref 3.5–5.0)
Alkaline Phosphatase: 46 U/L (ref 38–126)
Anion gap: 10 (ref 5–15)
BUN: 34 mg/dL — ABNORMAL HIGH (ref 8–23)
CO2: 20 mmol/L — ABNORMAL LOW (ref 22–32)
Calcium: 7.4 mg/dL — ABNORMAL LOW (ref 8.9–10.3)
Chloride: 108 mmol/L (ref 98–111)
Creatinine, Ser: 1.32 mg/dL — ABNORMAL HIGH (ref 0.61–1.24)
GFR calc Af Amer: 58 mL/min — ABNORMAL LOW (ref >60)
GFR calc non Af Amer: 50 mL/min — ABNORMAL LOW (ref >60)
Glucose, Bld: 171 mg/dL — ABNORMAL HIGH (ref 70–99)
Potassium: 3.1 mmol/L — ABNORMAL LOW (ref 3.5–5.1)
Sodium: 138 mmol/L (ref 135–145)
Total Bilirubin: 0.8 mg/dL (ref 0.3–1.2)
Total Protein: 5 g/dL — ABNORMAL LOW (ref 6.5–8.1)

## 2019-09-08 LAB — FERRITIN: Ferritin: 816 ng/mL — ABNORMAL HIGH (ref 24–336)

## 2019-09-08 LAB — MAGNESIUM: Magnesium: 1.1 mg/dL — ABNORMAL LOW (ref 1.7–2.4)

## 2019-09-08 LAB — D-DIMER, QUANTITATIVE: D-Dimer, Quant: 2.72 ug/mL-FEU — ABNORMAL HIGH (ref 0.00–0.50)

## 2019-09-08 LAB — GLUCOSE, CAPILLARY
Glucose-Capillary: 159 mg/dL — ABNORMAL HIGH (ref 70–99)
Glucose-Capillary: 134 mg/dL — ABNORMAL HIGH (ref 70–99)
Glucose-Capillary: 191 mg/dL — ABNORMAL HIGH (ref 70–99)
Glucose-Capillary: 172 mg/dL — ABNORMAL HIGH (ref 70–99)

## 2019-09-08 LAB — C-REACTIVE PROTEIN: CRP: 4.1 mg/dL — ABNORMAL HIGH (ref <1.0)

## 2019-09-08 LAB — PHOSPHORUS: Phosphorus: 2.8 mg/dL (ref 2.5–4.6)

## 2019-09-08 MED ORDER — POTASSIUM CHLORIDE CRYS ER 20 MEQ PO TBCR
40.0000 meq | EXTENDED_RELEASE_TABLET | Freq: Four times a day (QID) | ORAL | Status: AC
  Administered 2019-09-08 (×2): 40 meq via ORAL
  Filled 2019-09-08 (×2): qty 2

## 2019-09-08 NOTE — Progress Notes (Signed)
PROGRESS NOTE                                                                                                                                                                                                             Patient Demographics:    Julian Golden, is a 84 y.o. male, DOB - 11-Jan-1936, XTK:[id number]  Admit date - 09/06/2019   Admitting Physician Clydie Braun, MD  Outpatient Primary MD for the patient is Dough, Doris Cheadle, MD  LOS - 2   No chief complaint on file.      Brief Narrative    Julian Golden is a 84 y.o. male without reported significant past medical history hypertension, hyperlipidemia, and diabetes mellitus type 2 presented initially to [facility] on 4/7 with reports of weakness.   He was reported to not be on any blood thinners, but have reportedly been having black diarrhea for the last 5 days.  The patient was noted to be afebrile, pulse 123, respirations 44, blood pressure 93/50, and O2 saturation 89% on room air.  He was placed on 2 L of cannula oxygen to maintain O2 saturations.  Labs revealed WBC 10.7, hemoglobin 14.9, platelets 202, BUN 43, Cr 2.1, Lactic acid 5.9, procalcitonin 0.81, CRP 79. CXR revealed a multifocal pna. Covid pcr was positive. 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  CT scan of thee chest, abdomen, and pelvis revealed patchy bilateral opacities in the mid and lower lungs, aortic atherosclerosis with a 4.4 cm infrarenal renal abdominal aortic aneurysm.  He has been treated with IV fluids, Protonix drip, Decadron, remdesivir.  Repeat labs on 4/8 revealed hemoglobin dropped down to 11.6, BUN 36, creatinine 1.6, and lactic acid was clearing.  Transfer had been requested for the possible need of GI consultative services. Patient denies any use of any NSAIDs because it messes up his stomach. He does take a daily aspirin.      Subjective:     Julian Golden today reports poor sleep overnight and insomnia, patient has multiple watery bowel movement yesterday, was dark brown in color, no evidence of blood or melena .   Assessment  & Plan :    Principal Problem:   GI bleed Active Problems:   Pneumonia due to COVID-19 virus   AKI (acute kidney  injury) [facility])   Essential hypertension   Diabetes mellitus type 2 in obese ([place])   AAA (abdominal aortic aneurysm) ([place])   DNR (do not resuscitate)   Acute blood loss anemia   GI bleed, Acute blood loss anemia:  - Patient presents with complaints of dark stools and weakness.  Stool guaiacs were noted to be positive at the outside facility with initial hemoglobin 14.9->11.6.  The elevated BUN  suggest possible upper GI bleed. -Admitting physician discussed with GI, given stable hemoglobin is COVID-19 status, no work-up is currently indicated, otherwise work-up can be done as an outpatient. -This is most likely upper GI bleed, continue with Protonix drip. -Hemoglobin has been stable, continue to monitor closely, no indication for transfusion. -Advance diet as tolerated.  Advance today to soft diet -Did not follow with GI physician in the past, never had endoscopy or colonoscopy, I have discussed with daughter importance of outpatient GI follow-up especially for screening colonoscopy after he recovers from Covid.  Acute respiratory failure with hypoxia secondary to pneumonia due to COVID-19: -Chest x-ray significant for multifocal opacity. -Still requiring some oxygen, but he is currently on room air. -Courage to use incentive spirometry and flutter valve, and to get out of bed to chair. -Continue with steroids. -Continue with IV remdesivir -So far no indication for Actemra. -Elevated procalcitonin 0.8 at [facility] ED, will start on IV Rocephin and azithromycin, especially with elevated lactic acid 5.9. -Markers closely especially they are elevated on admission.   COVID-19  Labs  Recent Labs    09/06/19 1055 09/07/19 0344 09/08/19 0352  DDIMER 4.15* 3.17* 2.72*  FERRITIN 1,347* 1,082* 816*  CRP 11.6* 7.8* 4.1*    No results found for: SARSCOV2NAA  Acute kidney injury:  - Patient baseline creatinine unknown.  At the outside facility patient's creatinine was noted to be 1.9 with BUN 40. -Improving with IV fluids, getting improved to 1.3 today . - hold diuresis and nephrotoxic medications.  ?COPD -Continue trilogy Elliptia  Diabetes mellitus type 2: Home medications include Metformin 1000 mg twice daily. -Hypoglycemic protocol -Hold Metformin  -CBGs before every meal with sensitive SSI  Essential hypertension:  - Patient had initially been noted to be hypotensive.  Blood pressures currently maintained.  Home blood pressure medications include irbesartan-hydrochlorothiazide 300-12.5mg  daily and spironolactone 25 mg daily. -Hold blood pressure medications  AAA: Patient noted to have a 4.4 infrarenal abdominal aortic aneurysm on CT imaging at outside facility. -Recommending outpatient follow-up with rep  Code Status : DNR  Family Communication  : D/W daughter 4/9  Disposition Plan  : Pending PT.  Barriers For Discharge : on IV remdesivir, Protonix drip, monitor for GI bleed   Consults  :  None  Procedures  : None  DVT Prophylaxis  :  SCD  Lab Results  Component Value Date   PLT 122 (L) 09/08/2019    Antibiotics  :    Anti-infectives (From admission, onward)   Start     Dose/Rate Route Frequency Ordered Stop   09/07/19 1400  azithromycin (ZITHROMAX) 500 mg in sodium chloride 0.9 % 250 mL IVPB     500 mg 250 mL/hr over 60 Minutes Intravenous Every 24 hours 09/07/19 1259     09/07/19 1300  cefTRIAXone (ROCEPHIN) 1 g in sodium chloride 0.9 % 100 mL IVPB     1 g 200 mL/hr over 30 Minutes Intravenous Every 24 hours 09/07/19 1259     09/06/19 1600  remdesivir 100 mg in sodium chloride 0.9 %  100 mL IVPB     100 mg 200 mL/hr over 30  Minutes Intravenous Daily 09/06/19 1104 09/10/19 0959        Objective:   Vitals:   09/07/19 1400 09/07/19 1834 09/07/19 2000 09/08/19 0439  BP:  121/77 136/82 134/79  Pulse: 81 89 89   Resp: 20 (!) 24 (!) 23   Temp:  98.6 F (37 C) 98.3 F (36.8 C) 97.9 F (36.6 C)  TempSrc:  Oral Oral   SpO2:  95% 90%     Wt Readings from Last 3 Encounters:  No data found for Wt     Intake/Output Summary (Last 24 hours) at 09/08/2019 1338 Last data filed at 09/08/2019 0900 Gross per 24 hour  Intake 2956.39 ml  Output 1 ml  Net 2955.39 ml     Physical Exam  Awake Alert, Oriented X 3, No new F.N deficits, Normal affect Patient is legally blind Symmetrical Chest wall movement, Good air movement bilaterally, CTAB RRR,No Gallops,Rubs or new Murmurs, No Parasternal Heave +ve B.Sounds, Abd Soft, No tenderness, No rebound - guarding or rigidity. No Cyanosis, Clubbing or edema, No new Rash or bruise      Data Review:    CBC Recent Labs  Lab 09/06/19 1055 09/06/19 2228 09/07/19 0344 09/08/19 0352  WBC 6.1  --  6.5 5.4  HGB 11.6* 11.4* 11.4* 11.2*  HCT 34.2* 32.9* 33.2* 33.3*  PLT 124*  --  127* 122*  MCV 95.0  --  93.3 95.1  MCH 32.2  --  32.0 32.0  MCHC 33.9  --  34.3 33.6  RDW 15.4  --  15.0 15.3  LYMPHSABS 0.6*  --  0.5* 0.5*  MONOABS 0.2  --  0.4 0.4  EOSABS 0.0  --  0.0 0.0  BASOSABS 0.0  --  0.0 0.0    Chemistries  Recent Labs  Lab 09/06/19 1055 09/07/19 0344 09/08/19 0352  NA 137 139 138  K 3.3* 3.6 3.1*  CL 106 107 108  CO2 19* 20* 20*  GLUCOSE 140* 170* 171*  BUN 33* 35* 34*  CREATININE 1.65* 1.37* 1.32*  CALCIUM 7.4* 7.8* 7.4*  MG 1.1* 1.2* 1.1*  AST 67* 58* 46*  ALT 25 24 26   ALKPHOS 49 49 46  BILITOT 0.6 0.7 0.8   ------------------------------------------------------------------------------------------------------------------ No results for input(s): CHOL, HDL, LDLCALC, TRIG, CHOLHDL, LDLDIRECT in the last 72 hours.  Lab Results  Component  Value Date   HGBA1C 6.9 (H) 09/06/2019   ------------------------------------------------------------------------------------------------------------------ No results for input(s): TSH, T4TOTAL, T3FREE, THYROIDAB in the last 72 hours.  Invalid input(s): FREET3 ------------------------------------------------------------------------------------------------------------------ Recent Labs    09/07/19 0344 09/08/19 0352  FERRITIN 1,082* 816*    Coagulation profile No results for input(s): INR, PROTIME in the last 168 hours.  Recent Labs    09/07/19 0344 09/08/19 0352  DDIMER 3.17* 2.72*    Cardiac Enzymes No results for input(s): CKMB, TROPONINI, MYOGLOBIN in the last 168 hours.  Invalid input(s): CK ------------------------------------------------------------------------------------------------------------------ No results found for: BNP  Inpatient Medications  Scheduled Meds: . albuterol  2 puff Inhalation Q6H  . vitamin C  500 mg Oral Daily  . atorvastatin  80 mg Oral Daily  . Chlorhexidine Gluconate Cloth  6 each Topical Daily  . dexamethasone  6 mg Oral Q24H  . fluticasone furoate-vilanterol  1 puff Inhalation Daily   And  . umeclidinium bromide  1 puff Inhalation Daily  . insulin aspart  0-5 Units Subcutaneous QHS  . insulin aspart  0-9 Units Subcutaneous TID WC  . [START ON 09/09/2019] pantoprazole  40 mg Intravenous Q12H  . sodium chloride flush  10-40 mL Intracatheter Q12H  . sodium chloride flush  3 mL Intravenous Q12H  . zinc sulfate  220 mg Oral Daily   Continuous Infusions: . sodium chloride 75 mL/hr at 09/08/19 0504  . azithromycin 500 mg (09/08/19 1230)  . cefTRIAXone (ROCEPHIN)  IV 1 g (09/08/19 1302)  . pantoprozole (PROTONIX) infusion 8 mg/hr (09/08/19 0505)  . remdesivir 100 mg in NS 100 mL 100 mg (09/08/19 0911)   PRN Meds:.acetaminophen, chlorpheniramine-HYDROcodone, guaiFENesin-dextromethorphan, loperamide, ondansetron **OR** ondansetron  (ZOFRAN) IV, sodium chloride flush  Micro Results No results found for this or any previous visit (from the past 240 hour(s)).  Radiology Reports DG Chest Port 1 View  Result Date: 09/07/2019 CLINICAL DATA:  COVID-19 positive EXAM: PORTABLE CHEST 1 VIEW COMPARISON:  September 05, 2019 FINDINGS: There is ill-defined opacity in the left mid lung and in each lower lung region with small left pleural effusion. No consolidation. There is cardiomegaly with pulmonary vascularity normal. No adenopathy. There is aortic atherosclerosis. No bone lesions. IMPRESSION: Ill-defined opacity in the left mid lung and both lung bases consistent with multifocal pneumonia. Overall increase in opacity compared to 2 days prior. Small left pleural effusion. Stable cardiac prominence. No adenopathy demonstrable. Aortic Atherosclerosis (ICD10-I70.0). Electronically Signed   By: Bretta Bang III M.D.   On: 09/07/2019 09:51     Huey Bienenstock M.D on 09/08/2019 at 1:38 PM  Between 7am to 7pm - Pager - [phone]  After 7pm go to [email or web address] - password [facility]  [facility] -  Office  [phone]

## 2019-09-09 DIAGNOSIS — D62 Acute posthemorrhagic anemia: Secondary | ICD-10-CM | POA: Diagnosis not present

## 2019-09-09 DIAGNOSIS — N179 Acute kidney failure, unspecified: Secondary | ICD-10-CM | POA: Diagnosis not present

## 2019-09-09 DIAGNOSIS — U071 COVID-19: Secondary | ICD-10-CM | POA: Diagnosis not present

## 2019-09-09 DIAGNOSIS — K922 Gastrointestinal hemorrhage, unspecified: Secondary | ICD-10-CM | POA: Diagnosis not present

## 2019-09-09 LAB — CBC WITH DIFFERENTIAL/PLATELET
Abs Immature Granulocytes: 0.05 K/uL (ref 0.00–0.07)
Basophils Absolute: 0 K/uL (ref 0.0–0.1)
Basophils Relative: 0 %
Eosinophils Absolute: 0 K/uL (ref 0.0–0.5)
Eosinophils Relative: 0 %
HCT: 34.5 % — ABNORMAL LOW (ref 39.0–52.0)
Hemoglobin: 11.5 g/dL — ABNORMAL LOW (ref 13.0–17.0)
Immature Granulocytes: 1 %
Lymphocytes Relative: 9 %
Lymphs Abs: 0.5 K/uL — ABNORMAL LOW (ref 0.7–4.0)
MCH: 31.9 pg (ref 26.0–34.0)
MCHC: 33.3 g/dL (ref 30.0–36.0)
MCV: 95.8 fL (ref 80.0–100.0)
Monocytes Absolute: 0.5 K/uL (ref 0.1–1.0)
Monocytes Relative: 8 %
Neutro Abs: 4.7 K/uL (ref 1.7–7.7)
Neutrophils Relative %: 82 %
Platelets: 138 K/uL — ABNORMAL LOW (ref 150–400)
RBC: 3.6 MIL/uL — ABNORMAL LOW (ref 4.22–5.81)
RDW: 15.3 % (ref 11.5–15.5)
WBC: 5.7 K/uL (ref 4.0–10.5)
nRBC: 0 % (ref 0.0–0.2)

## 2019-09-09 LAB — COMPREHENSIVE METABOLIC PANEL
ALT: 27 U/L (ref 0–44)
AST: 40 U/L (ref 15–41)
Albumin: 2.2 g/dL — ABNORMAL LOW (ref 3.5–5.0)
Alkaline Phosphatase: 47 U/L (ref 38–126)
Anion gap: 11 (ref 5–15)
BUN: 28 mg/dL — ABNORMAL HIGH (ref 8–23)
CO2: 20 mmol/L — ABNORMAL LOW (ref 22–32)
Calcium: 7.4 mg/dL — ABNORMAL LOW (ref 8.9–10.3)
Chloride: 111 mmol/L (ref 98–111)
Creatinine, Ser: 1.13 mg/dL (ref 0.61–1.24)
GFR calc Af Amer: >60 mL/min (ref >60)
GFR calc non Af Amer: >60 mL/min (ref >60)
Glucose, Bld: 176 mg/dL — ABNORMAL HIGH (ref 70–99)
Potassium: 3.8 mmol/L (ref 3.5–5.1)
Sodium: 142 mmol/L (ref 135–145)
Total Bilirubin: 0.9 mg/dL (ref 0.3–1.2)
Total Protein: 5.1 g/dL — ABNORMAL LOW (ref 6.5–8.1)

## 2019-09-09 LAB — GLUCOSE, CAPILLARY
Glucose-Capillary: 143 mg/dL — ABNORMAL HIGH (ref 70–99)
Glucose-Capillary: 121 mg/dL — ABNORMAL HIGH (ref 70–99)
Glucose-Capillary: 172 mg/dL — ABNORMAL HIGH (ref 70–99)
Glucose-Capillary: 153 mg/dL — ABNORMAL HIGH (ref 70–99)

## 2019-09-09 LAB — D-DIMER, QUANTITATIVE: D-Dimer, Quant: 2.6 ug/mL-FEU — ABNORMAL HIGH (ref 0.00–0.50)

## 2019-09-09 LAB — PHOSPHORUS: Phosphorus: 1.9 mg/dL — ABNORMAL LOW (ref 2.5–4.6)

## 2019-09-09 LAB — MAGNESIUM: Magnesium: 1.2 mg/dL — ABNORMAL LOW (ref 1.7–2.4)

## 2019-09-09 LAB — C-REACTIVE PROTEIN: CRP: 2.3 mg/dL — ABNORMAL HIGH

## 2019-09-09 LAB — FERRITIN: Ferritin: 560 ng/mL — ABNORMAL HIGH (ref 24–336)

## 2019-09-09 MED ORDER — MAGNESIUM SULFATE 2 GM/50ML IV SOLN
2.0000 g | Freq: Once | INTRAVENOUS | Status: AC
  Administered 2019-09-09: 2 g via INTRAVENOUS
  Filled 2019-09-09: qty 50

## 2019-09-09 MED ORDER — PANTOPRAZOLE SODIUM 40 MG PO TBEC
40.0000 mg | DELAYED_RELEASE_TABLET | Freq: Two times a day (BID) | ORAL | Status: DC
  Administered 2019-09-09 – 2019-09-11 (×4): 40 mg via ORAL
  Filled 2019-09-09 (×4): qty 1

## 2019-09-09 NOTE — Evaluation (Signed)
Occupational Therapy Evaluation Patient Details Name: Julian Golden MRN: [id number] DOB: July 04, 1935 Today's Date: 09/09/2019    History of Present Illness Julian Golden is a 84 y.o. male without reported significant past medical history hypertension, hyperlipidemia, and diabetes mellitus type 2 presented initially to [facility] on 4/7 with reports of weakness. CXR revealed a multifocal pna. Covid pcr was positive. 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 Thought to have GIB, no work up is currently indicated.    Clinical Impression   This 84 y/o male presents with the above. PTA Pt living alone, reports completing ADL and functional mobility with mod independence. Pt currently presenting with overall weakness, decreased activity tolerance and compromised cardiorespiratory status with activity. Pt able to perform functional transfers using RW, requiring overall minA and cues for safety/sequencing; he currently requires maxA for LB ADL (without use of AE, reports uses AE at home) and minA for seated UB ADL. Pt with notable increase in WOB with stand pivot transfers today (completing x2, EOB<>recliner), with SpO2 >/=86% on RA, max HR 126 and x1 instance of RR into the low 40s. Due to increased DOE pt requiring increased time and seated rest in between bouts of activity. He will benefit from continued acute OT services, given current deficits and that pt lives alone feel he may require ST SNF therapies prior to return home to progress pt's safety and independence with ADL/mobility. If pt able to have 24hr supervision/assist at home he may be able to return home with [facility] services. Will follow.     Follow Up Recommendations  SNF;Supervision/Assistance - 24 hour;Home health OT(SNF vs home with 24hr assist initially)    Equipment Recommendations  None recommended by OT           Precautions / Restrictions  Precautions Precautions: Fall Restrictions Weight Bearing Restrictions: No      Mobility Bed Mobility Overal bed mobility: Needs Assistance Bed Mobility: Supine to Sit;Sit to Supine     Supine to sit: Min assist Sit to supine: Min guard   General bed mobility comments: assist for trunk elevation,  cues for technique. increased effort to return to supine (declined sitting up in recliner stating "it feels like I'm sitting on concrete")  Transfers Overall transfer level: Needs assistance Equipment used: Rolling walker (2 wheeled) Transfers: Sit to/from [facility] Sit to Stand: Min assist Stand pivot transfers: Min assist       General transfer comment: steadying assist throughout, VCs for safe hand placement     Balance Overall balance assessment: Needs assistance Sitting-balance support: Feet supported Sitting balance-Leahy Scale: Good     Standing balance support: Bilateral upper extremity supported Standing balance-Leahy Scale: Poor Standing balance comment: reliant on external support                           ADL either performed or assessed with clinical judgement   ADL Overall ADL's : Needs assistance/impaired Eating/Feeding: Set up;Sitting   Grooming: Set up;Min guard;Sitting   Upper Body Bathing: Minimal assistance;Sitting   Lower Body Bathing: Moderate assistance;Sit to/from stand   Upper Body Dressing : Minimal assistance;Sitting Upper Body Dressing Details (indicate cue type and reason): donning gown Lower Body Dressing: Maximal assistance;Sit to/from stand Lower Body Dressing Details (indicate cue type and reason): requires assist to don socks, reports typically uses sock aide to complete at home Toilet Transfer: Minimal assistance;Stand-pivot;RW   Toileting- Clothing  Manipulation and Hygiene: Moderate assistance;Sit to/from stand       Functional mobility during ADLs: Minimal assistance;Rolling walker General ADL  Comments: pt with poor activity tolerance and increased WOB with minimal activity today                          Pertinent Vitals/Pain Pain Assessment: No/denies pain     Hand Dominance     Extremity/Trunk Assessment Upper Extremity Assessment Upper Extremity Assessment: Generalized weakness   Lower Extremity Assessment Lower Extremity Assessment: Defer to PT evaluation       Communication Communication Communication: No difficulties   Cognition Arousal/Alertness: Awake/alert Behavior During Therapy: WFL for tasks assessed/performed Overall Cognitive Status: No family/caregiver present to determine baseline cognitive functioning                                 General Comments: will benefit from further assessment, following majority of commands but delayed processing noted, decreased awareness, pt without gown or blankets on upon arrival and with wet bed pad   General Comments       Exercises     Shoulder Instructions      Home Living Family/patient expects to be discharged to:: Private residence Living Arrangements: Alone Available Help at Discharge: Family;Available PRN/intermittently(children) Type of Home: Apartment Home Access: Level entry     Home Layout: One level         Firefighter: Standard     Home Equipment: Wheelchair - manual;Cane - single point;Shower seat;Bedside commode;Walker - 4 wheels;Adaptive equipment Adaptive Equipment: Reacher;Sock aid        Prior Functioning/Environment Level of Independence: Needs assistance  Gait / Transfers Assistance Needed: uses walker vs cane vs wheelchair ADL's / Homemaking Assistance Needed: states he is independent with ADL's/IADL's; daughter does grocery shopping            OT Problem List: Decreased strength;Decreased range of motion;Decreased activity tolerance;Impaired balance (sitting and/or standing);Decreased safety awareness;Decreased knowledge of use of DME or  AE;Cardiopulmonary status limiting activity;Obesity;Decreased cognition      OT Treatment/Interventions: Self-care/ADL training;Therapeutic exercise;Energy conservation;DME and/or AE instruction;Therapeutic activities;Patient/family education;Balance training;Cognitive remediation/compensation    OT Goals(Current goals can be found in the care plan section) Acute Rehab OT Goals Patient Stated Goal: go home Monday OT Goal Formulation: With patient Time For Goal Achievement: 09/23/19 Potential to Achieve Goals: Good  OT Frequency: Min 2X/week   Barriers to D/C:            Co-evaluation              AM-PAC OT "6 Clicks" Daily Activity     Outcome Measure Help from another person eating meals?: A Little Help from another person taking care of personal grooming?: A Little Help from another person toileting, which includes using toliet, bedpan, or urinal?: A Lot Help from another person bathing (including washing, rinsing, drying)?: A Lot Help from another person to put on and taking off regular upper body clothing?: A Little Help from another person to put on and taking off regular lower body clothing?: A Lot 6 Click Score: 15   End of Session Equipment Utilized During Treatment: Gait belt;Rolling walker Nurse Communication: Mobility status  Activity Tolerance: Patient tolerated treatment well Patient left: in bed;with call bell/phone within reach;with bed alarm set  OT Visit Diagnosis: Muscle weakness (generalized) (M62.81);Unsteadiness on feet (R26.81);Other (comment)(decreased activity tolerance)  Time: 0350-0938 OT Time Calculation (min): 33 min Charges:  OT General Charges $OT Visit: 1 Visit OT Evaluation $OT Eval Moderate Complexity: 1 Mod OT Treatments $Self Care/Home Management : 8-22 mins  Marcy Siren, OT Acute Rehabilitation Services Pager [phone] Office [phone]   Orlando Penner 09/09/2019, 4:24 PM

## 2019-09-09 NOTE — Progress Notes (Signed)
PT EVALUATION (LATE ENTRY NOTE)  Prior to admission, pt lives alone in a level entry apartment, is independent with ADL's, and uses assistive devices for ambulation. Pt has good family support for IADL's. Pt presents with decreased cardiopulmonary endurance, weakness and balance impairments. Ambulating limited room distances with a walker at a min guard assist level. SpO2 87-94% on RA, DOE 3/4. Would benefit from HHPT upon discharge to maximize functional independence.     Wyona Almas, PT, DPT Acute Rehabilitation Services Pager [phone] Office [phone]   09/08/19 1614  PT Visit Information  Last PT Received On 09/08/19  Assistance Needed +1  History of Present Illness Julian Golden is a 84 y.o. male without reported significant past medical history hypertension, hyperlipidemia, and diabetes mellitus type 2 presented initially to [facility] on 4/7 with reports of weakness. CXR revealed a multifocal pna. Covid pcr was positive. 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 Thought to have GIB, no work up is currently indicated.   Precautions  Precautions Fall;Other (comment)  Precaution Comments legally blind  Restrictions  Weight Bearing Restrictions No  Home Living  Family/patient expects to be discharged to: Private residence  Living Arrangements Alone  Available Help at Discharge Family;Available PRN/intermittently (children)  Type of Home Apartment  Home Access Level entry  Home Layout One level  Home Equipment Wheelchair - manual;Cane - single point;Shower seat;BSC;Walker - 4 wheels  Prior Function  Level of Independence Needs assistance  Gait / Transfers Assistance Needed uses walker vs cane vs wheelchair  ADL's / [place] states he is independent with ADL's/IADL's; daughter does grocery shopping  Communication  Communication No difficulties  Pain Assessment  Pain  Assessment No/denies pain  Cognition  Arousal/Alertness Awake/alert  Behavior During Therapy WFL for tasks assessed/performed  Overall Cognitive Status Within Functional Limits for tasks assessed  Upper Extremity Assessment  Upper Extremity Assessment Defer to OT evaluation  Lower Extremity Assessment  Lower Extremity Assessment Overall WFL for tasks assessed  Bed Mobility  Overal bed mobility Needs Assistance  Bed Mobility Supine to Sit  Supine to sit Supervision  Transfers  Overall transfer level Needs assistance  Equipment used Rolling walker (2 wheeled)  Transfers Sit to/from Stand  Sit to Stand Min guard  General transfer comment Min guard to rise from edge of bed  Ambulation/Gait  Ambulation/Gait assistance Min guard  Gait Distance (Feet) 3 Feet  Assistive device Rolling walker (2 wheeled)  Gait Pattern/deviations Step-through pattern;Decreased stride length;Trunk flexed  General Gait Details Min guard to take pivotal steps from bed to chair. Cues for direction and environmental negotiation  Balance  Overall balance assessment Needs assistance  Sitting-balance support Feet supported  Sitting balance-Leahy Scale Good  Standing balance support Bilateral upper extremity supported  Standing balance-Leahy Scale Poor  Standing balance comment reliant on external support  PT - End of Session  Equipment Utilized During Treatment Gait belt  Activity Tolerance Patient tolerated treatment well  Patient left in chair;with call bell/phone within reach;with chair alarm set  Nurse Communication Mobility status  PT Assessment  PT Recommendation/Assessment Patient needs continued PT services  PT Visit Diagnosis Difficulty in walking, not elsewhere classified (R26.2);Unsteadiness on feet (R26.81)  PT Problem List Decreased strength;Decreased activity tolerance;Decreased balance;Decreased mobility;Cardiopulmonary status limiting activity  PT Plan  PT Frequency (ACUTE ONLY) Min 3X/week   PT Treatment/Interventions (ACUTE ONLY) DME instruction;Gait training;Functional mobility training;Therapeutic activities;Therapeutic exercise;Balance training;Patient/family education  AM-PAC PT "6 Clicks" Mobility Outcome  Measure (Version 2)  Help needed turning from your back to your side while in a flat bed without using bedrails? 4  Help needed moving from lying on your back to sitting on the side of a flat bed without using bedrails? 4  Help needed moving to and from a bed to a chair (including a wheelchair)? 3  Help needed standing up from a chair using your arms (e.g., wheelchair or bedside chair)? 3  Help needed to walk in hospital room? 3  Help needed climbing 3-5 steps with a railing?  2  6 Click Score 19  Consider Recommendation of Discharge To: Home with [facility]  PT Recommendation  Follow Up Recommendations Home health PT;Supervision for mobility/OOB  PT equipment None recommended by PT  Individuals Consulted  Consulted and Agree with Results and Recommendations Patient  Acute Rehab PT Goals  Patient Stated Goal go home Monday  PT Goal Formulation With patient  Time For Goal Achievement 09/22/19  Potential to Achieve Goals Good  PT Time Calculation  PT Start Time (ACUTE ONLY) 1618  PT Stop Time (ACUTE ONLY) 1645  PT Time Calculation (min) (ACUTE ONLY) 27 min  PT General Charges  $$ ACUTE PT VISIT 1 Visit  PT Evaluation  $PT Eval Moderate Complexity 1 Mod  PT Treatments  $Therapeutic Activity 8-22 mins

## 2019-09-09 NOTE — Progress Notes (Signed)
PROGRESS NOTE                                                                                                                                                                                                             Patient Demographics:    Julian Golden, is a 84 y.o. male, DOB - 1935/08/06, OXB:[id number]  Admit date - 09/06/2019   Admitting Physician Clydie Braun, MD  Outpatient Primary MD for the patient is Dough, Doris Cheadle, MD  LOS - 3   No chief complaint on file.      Brief Narrative    Julian Golden is a 84 y.o. male without reported significant past medical history hypertension, hyperlipidemia, and diabetes mellitus type 2 presented initially to [facility] on 4/7 with reports of weakness.   He was reported to not be on any blood thinners, but have reportedly been having black diarrhea for the last 5 days.  The patient was noted to be afebrile, pulse 123, respirations 44, blood pressure 93/50, and O2 saturation 89% on room air.  He was placed on 2 L of cannula oxygen to maintain O2 saturations.  Labs revealed WBC 10.7, hemoglobin 14.9, platelets 202, BUN 43, Cr 2.1, Lactic acid 5.9, procalcitonin 0.81, CRP 79. CXR revealed a multifocal pna. Covid pcr was positive. 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  CT scan of thee chest, abdomen, and pelvis revealed patchy bilateral opacities in the mid and lower lungs, aortic atherosclerosis with a 4.4 cm infrarenal renal abdominal aortic aneurysm.  He has been treated with IV fluids, Protonix drip, Decadron, remdesivir.  Repeat labs on 4/8 revealed hemoglobin dropped down to 11.6, BUN 36, creatinine 1.6, and lactic acid was clearing.  Transfer had been requested for the possible need of GI consultative services. Patient denies any use of any NSAIDs because it messes up his stomach. He does take a daily aspirin.      Subjective:     Julian Golden today reports poor sleep overnight and insomnia, patient has multiple watery bowel movement yesterday, was dark brown in color, no evidence of blood or melena .   Assessment  & Plan :    Principal Problem:   GI bleed Active Problems:   Pneumonia due to COVID-19 virus   AKI (acute kidney  injury) [facility])   Essential hypertension   Diabetes mellitus type 2 in obese (HCC)   AAA (abdominal aortic aneurysm) (HCC)   DNR (do not resuscitate)   Acute blood loss anemia   GI bleed, Acute blood loss anemia:  - Patient presents with complaints of dark stools and weakness.  Stool guaiacs were noted to be positive at the outside facility with initial hemoglobin 14.9->11.6.  The elevated BUN  suggest possible upper GI bleed. -Admitting physician discussed with GI, given stable hemoglobin is COVID-19 status, no work-up is currently indicated, otherwise work-up can be done as an outpatient. -This is most likely upper GI bleed, patient on Protonix drip, currently normal colored stools, will transition to p.o. Protonix. -Hemoglobin has been stable, continue to monitor closely, no indication for transfusion. -Advance diet as tolerated.  Advance today to soft diet -Did not follow with GI physician in the past, never had endoscopy or colonoscopy, I have discussed with daughter importance of outpatient GI follow-up especially for screening colonoscopy after he recovers from Covid.  Acute respiratory failure with hypoxia secondary to pneumonia due to COVID-19: -Chest x-ray significant for multifocal opacity. -Initially with some oxygen requirement, but he is currently on room air. -Courage to use incentive spirometry and flutter valve, and to get out of bed to chair. -Continue with steroids. -Treated with IV remdesivir -So far no indication for Actemra. -Elevated procalcitonin 0.8 at [facility] ED, on IV Rocephin and azithromycin, especially with elevated lactic acid 5.9. -Markers closely  especially they are elevated on admission.   COVID-19 Labs  Recent Labs    09/07/19 0344 09/08/19 0352 09/09/19 0308  DDIMER 3.17* 2.72* 2.60*  FERRITIN 1,082* 816* 560*  CRP 7.8* 4.1* 2.3*    No results found for: SARSCOV2NAA  Acute kidney injury:  - Patient baseline creatinine unknown.  At the outside facility patient's creatinine was noted to be 1.9 with BUN 40. -Improving with IV fluids, getting improved to 1.1 today . - hold diuresis and nephrotoxic medications.  ?COPD -Continue trilogy Elliptia  Diabetes mellitus type 2: Home medications include Metformin 1000 mg twice daily. -Hypoglycemic protocol -Hold Metformin  -CBGs before every meal with sensitive SSI  Essential hypertension:  - Patient had initially been noted to be hypotensive.  Blood pressures currently maintained.  Home blood pressure medications include irbesartan-hydrochlorothiazide 300-12.5mg  daily and spironolactone 25 mg daily. -Hold blood pressure medications  AAA: Patient noted to have a 4.4 infrarenal abdominal aortic aneurysm on CT imaging at outside facility. -Recommending outpatient follow-up with rep  Code Status : DNR  Family Communication  : D/W daughter 4/11  Disposition Plan  : Home with home health  Barriers For Discharge : Patient can be discharged home with home health tomorrow if hemoglobin is stable, and of IV antibiotics.  Consults  :  None  Procedures  : None  DVT Prophylaxis  :  SCD  Lab Results  Component Value Date   PLT 138 (L) 09/09/2019    Antibiotics  :    Anti-infectives (From admission, onward)   Start     Dose/Rate Route Frequency Ordered Stop   09/07/19 1400  azithromycin (ZITHROMAX) 500 mg in sodium chloride 0.9 % 250 mL IVPB     500 mg 250 mL/hr over 60 Minutes Intravenous Every 24 hours 09/07/19 1259 09/12/19 1359   09/07/19 1300  cefTRIAXone (ROCEPHIN) 1 g in sodium chloride 0.9 % 100 mL IVPB     1 g 200 mL/hr over 30 Minutes Intravenous Every  24  hours 09/07/19 1259 09/12/19 1259   09/06/19 1600  remdesivir 100 mg in sodium chloride 0.9 % 100 mL IVPB     100 mg 200 mL/hr over 30 Minutes Intravenous Daily 09/06/19 1104 09/09/19 0933        Objective:   Vitals:   09/08/19 1600 09/08/19 1959 09/08/19 2000 09/09/19 0543  BP: 124/72  126/66 (!) 150/85  Pulse: 81  75   Resp: (!) 28  16   Temp:  97.7 F (36.5 C)  98.2 F (36.8 C)  TempSrc:    Oral  SpO2: 90%  90%     Wt Readings from Last 3 Encounters:  No data found for Wt     Intake/Output Summary (Last 24 hours) at 09/09/2019 1601 Last data filed at 09/09/2019 0400 Gross per 24 hour  Intake 2433.86 ml  Output --  Net 2433.86 ml     Physical Exam  Awake Alert, Oriented X 3, No new F.N deficits, Normal affect Patient is legally blind Symmetrical Chest wall movement, Good air movement bilaterally, CTAB RRR,No Gallops,Rubs or new Murmurs, No Parasternal Heave +ve B.Sounds, Abd Soft, No tenderness, No rebound - guarding or rigidity. No Cyanosis, Clubbing or edema, No new Rash or bruise      Data Review:    CBC Recent Labs  Lab 09/06/19 1055 09/06/19 2228 09/07/19 0344 09/08/19 0352 09/09/19 0308  WBC 6.1  --  6.5 5.4 5.7  HGB 11.6* 11.4* 11.4* 11.2* 11.5*  HCT 34.2* 32.9* 33.2* 33.3* 34.5*  PLT 124*  --  127* 122* 138*  MCV 95.0  --  93.3 95.1 95.8  MCH 32.2  --  32.0 32.0 31.9  MCHC 33.9  --  34.3 33.6 33.3  RDW 15.4  --  15.0 15.3 15.3  LYMPHSABS 0.6*  --  0.5* 0.5* 0.5*  MONOABS 0.2  --  0.4 0.4 0.5  EOSABS 0.0  --  0.0 0.0 0.0  BASOSABS 0.0  --  0.0 0.0 0.0    Chemistries  Recent Labs  Lab 09/06/19 1055 09/07/19 0344 09/08/19 0352 09/09/19 0308  NA 137 139 138 142  K 3.3* 3.6 3.1* 3.8  CL 106 107 108 111  CO2 19* 20* 20* 20*  GLUCOSE 140* 170* 171* 176*  BUN 33* 35* 34* 28*  CREATININE 1.65* 1.37* 1.32* 1.13  CALCIUM 7.4* 7.8* 7.4* 7.4*  MG 1.1* 1.2* 1.1* 1.2*  AST 67* 58* 46* 40  ALT 25 24 26 27   ALKPHOS 49 49 46 47   BILITOT 0.6 0.7 0.8 0.9   ------------------------------------------------------------------------------------------------------------------ No results for input(s): CHOL, HDL, LDLCALC, TRIG, CHOLHDL, LDLDIRECT in the last 72 hours.  Lab Results  Component Value Date   HGBA1C 6.9 (H) 09/06/2019   ------------------------------------------------------------------------------------------------------------------ No results for input(s): TSH, T4TOTAL, T3FREE, THYROIDAB in the last 72 hours.  Invalid input(s): FREET3 ------------------------------------------------------------------------------------------------------------------ Recent Labs    09/08/19 0352 09/09/19 0308  FERRITIN 816* 560*    Coagulation profile No results for input(s): INR, PROTIME in the last 168 hours.  Recent Labs    09/08/19 0352 09/09/19 0308  DDIMER 2.72* 2.60*    Cardiac Enzymes No results for input(s): CKMB, TROPONINI, MYOGLOBIN in the last 168 hours.  Invalid input(s): CK ------------------------------------------------------------------------------------------------------------------ No results found for: BNP  Inpatient Medications  Scheduled Meds: . albuterol  2 puff Inhalation Q6H  . vitamin C  500 mg Oral Daily  . atorvastatin  80 mg Oral Daily  . Chlorhexidine Gluconate Cloth  6 each Topical Daily  .  dexamethasone  6 mg Oral Q24H  . fluticasone furoate-vilanterol  1 puff Inhalation Daily   And  . umeclidinium bromide  1 puff Inhalation Daily  . insulin aspart  0-5 Units Subcutaneous QHS  . insulin aspart  0-9 Units Subcutaneous TID WC  . pantoprazole  40 mg Oral BID  . sodium chloride flush  10-40 mL Intracatheter Q12H  . sodium chloride flush  3 mL Intravenous Q12H  . zinc sulfate  220 mg Oral Daily   Continuous Infusions: . azithromycin 500 mg (09/09/19 1245)  . cefTRIAXone (ROCEPHIN)  IV 1 g (09/09/19 1152)  . magnesium sulfate bolus IVPB 2 g (09/09/19 1431)   PRN  Meds:.acetaminophen, chlorpheniramine-HYDROcodone, guaiFENesin-dextromethorphan, loperamide, ondansetron **OR** ondansetron (ZOFRAN) IV, sodium chloride flush  Micro Results No results found for this or any previous visit (from the past 240 hour(s)).  Radiology Reports DG Chest Port 1 View  Result Date: 09/07/2019 CLINICAL DATA:  COVID-19 positive EXAM: PORTABLE CHEST 1 VIEW COMPARISON:  September 05, 2019 FINDINGS: There is ill-defined opacity in the left mid lung and in each lower lung region with small left pleural effusion. No consolidation. There is cardiomegaly with pulmonary vascularity normal. No adenopathy. There is aortic atherosclerosis. No bone lesions. IMPRESSION: Ill-defined opacity in the left mid lung and both lung bases consistent with multifocal pneumonia. Overall increase in opacity compared to 2 days prior. Small left pleural effusion. Stable cardiac prominence. No adenopathy demonstrable. Aortic Atherosclerosis (ICD10-I70.0). Electronically Signed   By: Bretta Bang III M.D.   On: 09/07/2019 09:51     Huey Bienenstock M.D on 09/09/2019 at 4:01 PM  Between 7am to 7pm - Pager - [phone]  After 7pm go to [email or web address] - password [email or web address]  [facility] -  Office  [phone]

## 2019-09-10 DIAGNOSIS — U071 COVID-19: Secondary | ICD-10-CM | POA: Diagnosis not present

## 2019-09-10 DIAGNOSIS — K922 Gastrointestinal hemorrhage, unspecified: Secondary | ICD-10-CM | POA: Diagnosis not present

## 2019-09-10 DIAGNOSIS — R45851 Suicidal ideations: Secondary | ICD-10-CM | POA: Diagnosis not present

## 2019-09-10 DIAGNOSIS — N179 Acute kidney failure, unspecified: Secondary | ICD-10-CM | POA: Diagnosis not present

## 2019-09-10 LAB — CBC WITH DIFFERENTIAL/PLATELET
Abs Immature Granulocytes: 0.05 10*3/uL (ref 0.00–0.07)
Basophils Absolute: 0 10*3/uL (ref 0.0–0.1)
Basophils Relative: 0 %
Eosinophils Absolute: 0 10*3/uL (ref 0.0–0.5)
Eosinophils Relative: 0 %
HCT: 31.2 % — ABNORMAL LOW (ref 39.0–52.0)
Hemoglobin: 10.6 g/dL — ABNORMAL LOW (ref 13.0–17.0)
Immature Granulocytes: 1 %
Lymphocytes Relative: 10 %
Lymphs Abs: 0.6 10*3/uL — ABNORMAL LOW (ref 0.7–4.0)
MCH: 31.9 pg (ref 26.0–34.0)
MCHC: 34 g/dL (ref 30.0–36.0)
MCV: 94 fL (ref 80.0–100.0)
Monocytes Absolute: 0.4 10*3/uL (ref 0.1–1.0)
Monocytes Relative: 7 %
Neutro Abs: 4.6 10*3/uL (ref 1.7–7.7)
Neutrophils Relative %: 82 %
Platelets: 129 10*3/uL — ABNORMAL LOW (ref 150–400)
RBC: 3.32 MIL/uL — ABNORMAL LOW (ref 4.22–5.81)
RDW: 14.8 % (ref 11.5–15.5)
Smear Review: ADEQUATE
WBC: 5.7 10*3/uL (ref 4.0–10.5)
nRBC: 0 % (ref 0.0–0.2)

## 2019-09-10 LAB — COMPREHENSIVE METABOLIC PANEL
ALT: 20 U/L (ref 0–44)
AST: 26 U/L (ref 15–41)
Albumin: 1.7 g/dL — ABNORMAL LOW (ref 3.5–5.0)
Alkaline Phosphatase: 36 U/L — ABNORMAL LOW (ref 38–126)
Anion gap: 11 (ref 5–15)
BUN: 18 mg/dL (ref 8–23)
CO2: 16 mmol/L — ABNORMAL LOW (ref 22–32)
Calcium: 6 mg/dL — CL (ref 8.9–10.3)
Chloride: 118 mmol/L — ABNORMAL HIGH (ref 98–111)
Creatinine, Ser: 0.8 mg/dL (ref 0.61–1.24)
GFR calc Af Amer: >60 mL/min (ref >60)
GFR calc non Af Amer: >60 mL/min (ref >60)
Glucose, Bld: 128 mg/dL — ABNORMAL HIGH (ref 70–99)
Potassium: 3 mmol/L — ABNORMAL LOW (ref 3.5–5.1)
Sodium: 145 mmol/L (ref 135–145)
Total Bilirubin: 0.6 mg/dL (ref 0.3–1.2)
Total Protein: 3.8 g/dL — ABNORMAL LOW (ref 6.5–8.1)

## 2019-09-10 LAB — GLUCOSE, CAPILLARY
Glucose-Capillary: 112 mg/dL — ABNORMAL HIGH (ref 70–99)
Glucose-Capillary: 110 mg/dL — ABNORMAL HIGH (ref 70–99)
Glucose-Capillary: 152 mg/dL — ABNORMAL HIGH (ref 70–99)
Glucose-Capillary: 150 mg/dL — ABNORMAL HIGH (ref 70–99)

## 2019-09-10 MED ORDER — FUROSEMIDE 10 MG/ML IJ SOLN
40.0000 mg | Freq: Once | INTRAMUSCULAR | Status: AC
  Administered 2019-09-10: 40 mg via INTRAVENOUS
  Filled 2019-09-10: qty 4

## 2019-09-10 MED ORDER — FUROSEMIDE 10 MG/ML IJ SOLN
20.0000 mg | Freq: Once | INTRAMUSCULAR | Status: AC
  Administered 2019-09-10: 20 mg via INTRAVENOUS
  Filled 2019-09-10: qty 2

## 2019-09-10 MED ORDER — CALCIUM CARBONATE ANTACID 500 MG PO CHEW
1.0000 | CHEWABLE_TABLET | Freq: Two times a day (BID) | ORAL | Status: DC
  Administered 2019-09-10 – 2019-09-11 (×3): 200 mg via ORAL
  Filled 2019-09-10 (×3): qty 1

## 2019-09-10 MED ORDER — ACETAMINOPHEN 325 MG PO TABS: 650.0000 mg | ORAL_TABLET | Freq: Four times a day (QID) | ORAL | Status: AC | PRN

## 2019-09-10 MED ORDER — CALCIUM GLUCONATE-NACL 1-0.675 GM/50ML-% IV SOLN
1.0000 g | Freq: Once | INTRAVENOUS | Status: AC
  Administered 2019-09-10: 1000 mg via INTRAVENOUS
  Filled 2019-09-10: qty 50

## 2019-09-10 MED ORDER — PANTOPRAZOLE SODIUM 40 MG PO TBEC: 40.0000 mg | DELAYED_RELEASE_TABLET | Freq: Two times a day (BID) | ORAL | 0 refills | Status: AC

## 2019-09-10 MED ORDER — CALCIUM CARBONATE ANTACID 500 MG PO CHEW: 1.0000 | CHEWABLE_TABLET | Freq: Two times a day (BID) | ORAL | 0 refills | Status: AC

## 2019-09-10 MED ORDER — AMOXICILLIN-POT CLAVULANATE 875-125 MG PO TABS: 1.0000 | ORAL_TABLET | Freq: Two times a day (BID) | ORAL | 0 refills | Status: AC

## 2019-09-10 MED ORDER — DEXAMETHASONE 6 MG PO TABS: 6.0000 mg | ORAL_TABLET | ORAL | 0 refills | Status: AC

## 2019-09-10 NOTE — TOC Transition Note (Addendum)
Transition of Care [facility]) - CM/SW Discharge Note   Patient Details  Name: Julian Golden MRN: [id number] Date of Birth: Feb 27, 1936  Transition of [facility]) CM/SW Contact:  Cherylann Parr, RN Phone Number: 09/10/2019, 11:47 AM   Clinical Narrative:   Pt deemed stable for discharge home today.  PTA pt lived alone.  Pt declined SNF as recommended.   CM explained the recommendation for 24/7 supervision initially.  Pt informed CM that his family will provide supervision.  Pt in agreement with HH as ordered.  Pt confirms he has a PCP and denied barriers with paying for discharge medications.   CM offered medicare.gov HH choice - pt did not have a preference.  [facility] accepts pt.  Pt in agreement for CM to speak with daughter regarding discharge plan - daughter confirms family will provide recommended supervision of 24/7.  Daughter will transport pt home at discharge.     Discharge order signed - no outstanding TOC needs identified - CM signing off  Update:  Post discharge order being written pt communicated suicidal ideation to nurse -discharge order discontinue and psych eval ordered    Final next level of care: Home w Home Health Services Barriers to Discharge: Barriers Resolved   Patient Goals and CMS Choice Patient states their goals for this hospitalization and ongoing recovery are:: Pt states he is ready to get back home be independent CMS Medicare.gov Compare Post Acute Care list provided to:: Patient Choice offered to / list presented to : Patient  Discharge Placement                       Discharge Plan and Services                          HH Arranged: PT, OT, RN, Aide [facility] Agency: [facility] Date [facility] Agency Contacted: 09/10/19 Time HH Agency Contacted: [phone]    Social Determinants of Health (SDOH) Interventions     Readmission Risk Interventions No flowsheet data found.

## 2019-09-10 NOTE — Progress Notes (Signed)
Physical Therapy Treatment Patient Details Name: Julian Golden MRN: [id number] DOB: 03-02-1936 Today's Date: 09/10/2019    History of Present Illness Julian Golden is a 84 y.o. male without reported significant past medical history hypertension, hyperlipidemia, and diabetes mellitus type 2 presented initially to [facility] on 4/7 with reports of weakness. CXR revealed a multifocal pna. Covid pcr was positive. 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 Thought to have GIB, no work up is currently indicated.     PT Comments    Pt was able to ambulate 6'x4 but fatigued very easily with O2 sats 86%, DOE 4/4, HR 130-140 and required ~1 min sitting rest to recover.  Pt with poor balance and high fall risk due to balance, fatigue, and SHOB.  He is refusing SNF at this time.  If going home, recommend 24 hr assist and max HH services. Also, discussed use of his w/c until progressed with therapy at home.    Follow Up Recommendations  Home health PT;Supervision/Assistance - 24 hour;Other (comment)(Pt refusing PT recommendation of SNF.  If going home recommend 24 hr support and max HH services possible)     Equipment Recommendations  None recommended by PT    Recommendations for Other Services       Precautions / Restrictions Precautions Precautions: Fall Precaution Comments: legally blind    Mobility  Bed Mobility Overal bed mobility: Needs Assistance Bed Mobility: Supine to Sit     Supine to sit: Min assist;HOB elevated     General bed mobility comments: increased time and effort  Transfers Overall transfer level: Needs assistance Equipment used: Rolling walker (2 wheeled) Transfers: Sit to/from Stand Sit to Stand: Min assist         General transfer comment: sit to stand x 5 with min A, cues for hand placement  Ambulation/Gait Ambulation/Gait assistance: Min assist Gait Distance (Feet): 6  Feet(x4) Assistive device: Rolling walker (2 wheeled) Gait Pattern/deviations: Step-to pattern;Decreased stride length;Shuffle;Trunk flexed Gait velocity: decreased   General Gait Details: min A to steady and control walker in tight spaces; required 3 rest breaks to ambulate to/from bathroom; fatigued easily with DOE of 4/4 and required cue to sit and rest   Stairs             Wheelchair Mobility    Modified Rankin (Stroke Patients Only)       Balance Overall balance assessment: Needs assistance Sitting-balance support: Feet supported Sitting balance-Leahy Scale: Good     Standing balance support: Single extremity supported;During functional activity Standing balance-Leahy Scale: Poor Standing balance comment: required single limb supported and min A for steadying during toileting ADLs                            Cognition Arousal/Alertness: Awake/alert Behavior During Therapy: WFL for tasks assessed/performed Overall Cognitive Status: No family/caregiver present to determine baseline cognitive functioning Area of Impairment: Safety/judgement                         Safety/Judgement: Decreased awareness of safety;Decreased awareness of deficits            Exercises      General Comments General comments (skin integrity, edema, etc.):  Pt on RA with sats 92% rest, dropped to 86% walking but refused O2, sats recoverd to 90% in 30 seconds-1 min.  Pt's HR 90's rest and up to  130's-140 bpm with activity again taking about 1 min to recover.  DOE 4/4 with activity.   Discussed need for assist and fall risk with pt and that PT did not feel comfortable with him going home.  He stated "Please don't worry about me."  Pt declined SNF due to money and wanting to go home.  Discussed PT strongly recommends SNF.  Notified MD and Case worker of recommendation but that pt refusing SNF.  Case worker spoke with daughter who reports that pt will deny SNF and that  they can provide 24 hr supervision.  Notified MD and CW that if going home pt has DME but recommend max HH services possible including PT,OT, RN, aide.      Pertinent Vitals/Pain Pain Assessment: No/denies pain    Home Living                      Prior Function            PT Goals (current goals can now be found in the care plan section) Progress towards PT goals: Progressing toward goals    Frequency    Min 3X/week      PT Plan Current plan remains appropriate    Co-evaluation              AM-PAC PT "6 Clicks" Mobility   Outcome Measure  Help needed turning from your back to your side while in a flat bed without using bedrails?: A Little Help needed moving from lying on your back to sitting on the side of a flat bed without using bedrails?: A Little Help needed moving to and from a bed to a chair (including a wheelchair)?: A Little Help needed standing up from a chair using your arms (e.g., wheelchair or bedside chair)?: A Little Help needed to walk in hospital room?: A Little Help needed climbing 3-5 steps with a railing? : A Lot 6 Click Score: 17    End of Session Equipment Utilized During Treatment: Gait belt Activity Tolerance: Patient limited by fatigue Patient left: in chair;with call bell/phone within reach;with chair alarm set Nurse Communication: Mobility status PT Visit Diagnosis: Difficulty in walking, not elsewhere classified (R26.2);Unsteadiness on feet (R26.81)     Time: 5003-7048 PT Time Calculation (min) (ACUTE ONLY): 40 min  Charges:  $Gait Training: 23-37 mins $Therapeutic Activity: 8-22 mins                     Maggie Font, PT Acute Rehab Services Pager [phone] [facility] [phone]    Julian Golden 09/10/2019, 1:20 PM

## 2019-09-10 NOTE — Progress Notes (Signed)
PROGRESS NOTE                                                                                                                                                                                                             Patient Demographics:    Julian Golden, is a 84 y.o. male, DOB - August 12, 1935, IHK:[id number]  Admit date - 09/06/2019   Admitting Physician Norval Morton, MD  Outpatient Primary MD for the patient is Dough, Jaymes Graff, MD  LOS - 4   No chief complaint on file.      Brief Narrative    Julian Golden is a 84 y.o. male without reported significant past medical history hypertension, hyperlipidemia, and diabetes mellitus type 2 presented initially to [facility] on 4/7 with reports of weakness.   He was reported to not be on any blood thinners, but have reportedly been having black diarrhea for the last 5 days.  The patient was noted to be afebrile, pulse 123, respirations 44, blood pressure 93/50, and O2 saturation 89% on room air.  He was placed on 2 L of cannula oxygen to maintain O2 saturations.  Labs revealed WBC 10.7, hemoglobin 14.9, platelets 202, BUN 43, Cr 2.1, Lactic acid 5.9, procalcitonin 0.81, CRP 79. CXR revealed a multifocal pna. Covid pcr was positive. 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  CT scan of thee chest, abdomen, and pelvis revealed patchy bilateral opacities in the mid and lower lungs, aortic atherosclerosis with a 4.4 cm infrarenal renal abdominal aortic aneurysm.  He has been treated with IV fluids, Protonix drip, Decadron, remdesivir.  Repeat labs on 4/8 revealed hemoglobin dropped down to 11.6, BUN 36, creatinine 1.6, and lactic acid was clearing.  Transfer had been requested for the possible need of GI consultative services. Patient denies any use of any NSAIDs because it messes up his stomach. He does take a daily aspirin.      Subjective:     Julian Golden today ports he had some bowel movement this morning, with any chest pain, reports some cough, mild.  This afternoon patient reports to his nurse that he wishes he would have shot himself, when he was asked if he was serious, he responded "yeah one of these days ", and when he was asked if he  had a plan he said yes but he would not tell the nurse .    Assessment  & Plan :    Principal Problem:   GI bleed Active Problems:   Pneumonia due to COVID-19 virus   AKI (acute kidney injury) (HCC)   Essential hypertension   Diabetes mellitus type 2 in obese (HCC)   AAA (abdominal aortic aneurysm) (HCC)   DNR (do not resuscitate)   Acute blood loss anemia   GI bleed, Acute blood loss anemia:  - Patient presents with complaints of dark stools and weakness.  Stool guaiacs were noted to be positive at the outside facility with initial hemoglobin 14.9->11.6.  The elevated BUN  suggest possible upper GI bleed. -Admitting physician discussed with GI, given stable hemoglobin is COVID-19 status, no work-up is currently indicated, otherwise work-up can be done as an outpatient. -This is most likely upper GI bleed, patient on Protonix drip, currently normal colored stools, will transition to p.o. Protonix. -Hemoglobin has been stable,no indication for transfusion. -Advance diet as tolerated.  Advance today to soft diet -Did not follow with GI physician in the past, never had endoscopy or colonoscopy, I have discussed with daughter importance of outpatient GI follow-up especially for screening colonoscopy after he recovers from Covid.  Acute respiratory failure with hypoxia secondary to pneumonia due to COVID-19: -Chest x-ray significant for multifocal opacity. -Initially with some oxygen requirement, but he is currently on room air. -Courage to use incentive spirometry and flutter valve, and to get out of bed to chair. -Continue with steroids. -Treated with IV remdesivir -So far no  indication for Actemra. -Elevated procalcitonin 0.8 at [facility] ED, on IV Rocephin and azithromycin, especially with elevated lactic acid 5.9. -Markers closely especially they are elevated on admission.   COVID-19 Labs  Recent Labs    09/08/19 0352 09/09/19 0308  DDIMER 2.72* 2.60*  FERRITIN 816* 560*  CRP 4.1* 2.3*    No results found for: SARSCOV2NAA  Acute kidney injury:  - Patient baseline creatinine unknown.  At the outside facility patient's creatinine was noted to be 1.9 with BUN 40. -Improving with IV fluids,resolved. - hold diuresis and nephrotoxic medications.  ?COPD -Continue trilogy Elliptia  Hypocalcemia -Repleted  Suicidal Ideations - This afternoon before discharge patient reports to his nurse that he wishes he would have shot himself, when he was asked if he was serious, he responded "yeah one of these days ", and when he was asked if he had a plan he said yes but he would not tell the nurse . -Consult psych. -will cancel his  his discharge, will keep on suicide precaution   diabetes mellitus type 2: Home medications include Metformin 1000 mg twice daily. -Hypoglycemic protocol -Hold Metformin  -CBGs before every meal with sensitive SSI  Essential hypertension:  - Patient had initially been noted to be hypotensive.  Blood pressures currently maintained.  Home blood pressure medications include irbesartan-hydrochlorothiazide 300-12.5mg  daily and spironolactone 25 mg daily. -Hold blood pressure medications  AAA: Patient noted to have a 4.4 infrarenal abdominal aortic aneurysm on CT imaging at outside facility. -Recommending outpatient follow-up with rep  Code Status : DNR  Family Communication  : D/W daughter 4/12  Disposition Plan  : Home with home health  Barriers For Discharge : On suicide precaution, awaiting psych evaluation  Consults  :  None  Procedures  : None  DVT Prophylaxis  :  SCD  Lab Results  Component Value Date   PLT 129  (L)  09/10/2019    Antibiotics  :    Anti-infectives (From admission, onward)   Start     Dose/Rate Route Frequency Ordered Stop   09/11/19 0000  amoxicillin-clavulanate (AUGMENTIN) 875-125 MG tablet     1 tablet Oral 2 times daily 09/10/19 1115 09/14/19 2359   09/07/19 1400  azithromycin (ZITHROMAX) 500 mg in sodium chloride 0.9 % 250 mL IVPB     500 mg 250 mL/hr over 60 Minutes Intravenous Every 24 hours 09/07/19 1259 09/12/19 1359   09/07/19 1300  cefTRIAXone (ROCEPHIN) 1 g in sodium chloride 0.9 % 100 mL IVPB     1 g 200 mL/hr over 30 Minutes Intravenous Every 24 hours 09/07/19 1259 09/12/19 1259   09/06/19 1600  remdesivir 100 mg in sodium chloride 0.9 % 100 mL IVPB     100 mg 200 mL/hr over 30 Minutes Intravenous Daily 09/06/19 1104 09/09/19 0933        Objective:   Vitals:   09/10/19 0300 09/10/19 0400 09/10/19 0546 09/10/19 1257  BP:   131/72 137/82  Pulse: 69 73 79   Resp: 20 20 (!) 25   Temp:   97.9 F (36.6 C) 98.3 F (36.8 C)  TempSrc:   Oral Oral  SpO2: 96% 96% 96%     Wt Readings from Last 3 Encounters:  No data found for Wt     Intake/Output Summary (Last 24 hours) at 09/10/2019 1349 Last data filed at 09/10/2019 0400 Gross per 24 hour  Intake 2059.76 ml  Output 1200 ml  Net 859.76 ml     Physical Exam  Awake Alert, Oriented X 3, No new F.N deficits, Normal affect Patient is legally blind Symmetrical Chest wall movement, Good air movement bilaterally, CTAB RRR,No Gallops,Rubs or new Murmurs, No Parasternal Heave +ve B.Sounds, Abd Soft, No tenderness, No rebound - guarding or rigidity. No Cyanosis, Clubbing or edema, No new Rash or bruise       Data Review:    CBC Recent Labs  Lab 09/06/19 1055 09/06/19 1055 09/06/19 2228 09/07/19 0344 09/08/19 0352 09/09/19 0308 09/10/19 0331  WBC 6.1  --   --  6.5 5.4 5.7 5.7  HGB 11.6*   < > 11.4* 11.4* 11.2* 11.5* 10.6*  HCT 34.2*   < > 32.9* 33.2* 33.3* 34.5* 31.2*  PLT 124*  --   --  127*  122* 138* 129*  MCV 95.0  --   --  93.3 95.1 95.8 94.0  MCH 32.2  --   --  32.0 32.0 31.9 31.9  MCHC 33.9  --   --  34.3 33.6 33.3 34.0  RDW 15.4  --   --  15.0 15.3 15.3 14.8  LYMPHSABS 0.6*  --   --  0.5* 0.5* 0.5* 0.6*  MONOABS 0.2  --   --  0.4 0.4 0.5 0.4  EOSABS 0.0  --   --  0.0 0.0 0.0 0.0  BASOSABS 0.0  --   --  0.0 0.0 0.0 0.0   < > = values in this interval not displayed.    Chemistries  Recent Labs  Lab 09/06/19 1055 09/07/19 0344 09/08/19 0352 09/09/19 0308 09/10/19 0331  NA 137 139 138 142 145  K 3.3* 3.6 3.1* 3.8 3.0*  CL 106 107 108 111 118*  CO2 19* 20* 20* 20* 16*  GLUCOSE 140* 170* 171* 176* 128*  BUN 33* 35* 34* 28* 18  CREATININE 1.65* 1.37* 1.32* 1.13 0.80  CALCIUM 7.4* 7.8* 7.4* 7.4* 6.0*  MG 1.1* 1.2* 1.1* 1.2*  --   AST 67* 58* 46* 40 26  ALT 25 24 26 27 20   ALKPHOS 49 49 46 47 36*  BILITOT 0.6 0.7 0.8 0.9 0.6   ------------------------------------------------------------------------------------------------------------------ No results for input(s): CHOL, HDL, LDLCALC, TRIG, CHOLHDL, LDLDIRECT in the last 72 hours.  Lab Results  Component Value Date   HGBA1C 6.9 (H) 09/06/2019   ------------------------------------------------------------------------------------------------------------------ No results for input(s): TSH, T4TOTAL, T3FREE, THYROIDAB in the last 72 hours.  Invalid input(s): FREET3 ------------------------------------------------------------------------------------------------------------------ Recent Labs    09/08/19 0352 09/09/19 0308  FERRITIN 816* 560*    Coagulation profile No results for input(s): INR, PROTIME in the last 168 hours.  Recent Labs    09/08/19 0352 09/09/19 0308  DDIMER 2.72* 2.60*    Cardiac Enzymes No results for input(s): CKMB, TROPONINI, MYOGLOBIN in the last 168 hours.  Invalid input(s):  CK ------------------------------------------------------------------------------------------------------------------ No results found for: BNP  Inpatient Medications  Scheduled Meds: . albuterol  2 puff Inhalation Q6H  . vitamin C  500 mg Oral Daily  . atorvastatin  80 mg Oral Daily  . calcium carbonate  1 tablet Oral BID WC  . Chlorhexidine Gluconate Cloth  6 each Topical Daily  . dexamethasone  6 mg Oral Q24H  . fluticasone furoate-vilanterol  1 puff Inhalation Daily   And  . umeclidinium bromide  1 puff Inhalation Daily  . furosemide  40 mg Intravenous Once  . insulin aspart  0-5 Units Subcutaneous QHS  . insulin aspart  0-9 Units Subcutaneous TID WC  . pantoprazole  40 mg Oral BID  . sodium chloride flush  10-40 mL Intracatheter Q12H  . sodium chloride flush  3 mL Intravenous Q12H  . zinc sulfate  220 mg Oral Daily   Continuous Infusions: . azithromycin 500 mg (09/09/19 1245)  . cefTRIAXone (ROCEPHIN)  IV 1 g (09/09/19 1152)   PRN Meds:.acetaminophen, chlorpheniramine-HYDROcodone, guaiFENesin-dextromethorphan, loperamide, ondansetron **OR** ondansetron (ZOFRAN) IV, sodium chloride flush  Micro Results No results found for this or any previous visit (from the past 240 hour(s)).  Radiology Reports DG Chest Port 1 View  Result Date: 09/07/2019 CLINICAL DATA:  COVID-19 positive EXAM: PORTABLE CHEST 1 VIEW COMPARISON:  September 05, 2019 FINDINGS: There is ill-defined opacity in the left mid lung and in each lower lung region with small left pleural effusion. No consolidation. There is cardiomegaly with pulmonary vascularity normal. No adenopathy. There is aortic atherosclerosis. No bone lesions. IMPRESSION: Ill-defined opacity in the left mid lung and both lung bases consistent with multifocal pneumonia. Overall increase in opacity compared to 2 days prior. Small left pleural effusion. Stable cardiac prominence. No adenopathy demonstrable. Aortic Atherosclerosis (ICD10-I70.0).  Electronically Signed   By: September 07, 2019 III M.D.   On: 09/07/2019 09:51     11/07/2019 M.D on 09/10/2019 at 1:49 PM  Between 7am to 7pm - Pager - [phone]  After 7pm go to [email or web address] - password [facility]  [facility] -  Office  [phone]

## 2019-09-10 NOTE — Discharge Instructions (Signed)
Person Under Monitoring Name: Julian Golden  Location: [address] [place] [address]   Infection Prevention Recommendations for Individuals Confirmed to have, or Being Evaluated for, 2019 Novel Coronavirus (COVID-19) Infection Who Receive Care at Home  Individuals who are confirmed to have, or are being evaluated for, COVID-19 should follow the prevention steps below until a healthcare provider or local or state health department says they can return to normal activities.  Stay home except to get medical care You should restrict activities outside your home, except for getting medical care. Do not go to work, school, or public areas, and do not use public transportation or taxis.  Call ahead before visiting your doctor Before your medical appointment, call the healthcare provider and tell them that you have, or are being evaluated for, COVID-19 infection. This will help the healthcare provider's office take steps to keep other people from getting infected. Ask your healthcare provider to call the local or state health department.  Monitor your symptoms Seek prompt medical attention if your illness is worsening (e.g., difficulty breathing). Before going to your medical appointment, call the healthcare provider and tell them that you have, or are being evaluated for, COVID-19 infection. Ask your healthcare provider to call the local or state health department.  Wear a facemask You should wear a facemask that covers your nose and mouth when you are in the same room with other people and when you visit a healthcare provider. People who live with or visit you should also wear a facemask while they are in the same room with you.  Separate yourself from other people in your home As much as possible, you should stay in a different room from other people in your home. Also, you should use a separate bathroom, if available.  Avoid sharing household items You should not share  dishes, drinking glasses, cups, eating utensils, towels, bedding, or other items with other people in your home. After using these items, you should wash them thoroughly with soap and water.  Cover your coughs and sneezes Cover your mouth and nose with a tissue when you cough or sneeze, or you can cough or sneeze into your sleeve. Throw used tissues in a lined trash can, and immediately wash your hands with soap and water for at least 20 seconds or use an alcohol-based hand rub.  Wash your [employer] your hands often and thoroughly with soap and water for at least 20 seconds. You can use an alcohol-based hand sanitizer if soap and water are not available and if your hands are not visibly dirty. Avoid touching your eyes, nose, and mouth with unwashed hands.   Prevention Steps for Caregivers and Household Members of Individuals Confirmed to have, or Being Evaluated for, COVID-19 Infection Being Cared for in the Home  If you live with, or provide care at home for, a person confirmed to have, or being evaluated for, COVID-19 infection please follow these guidelines to prevent infection:  Follow healthcare provider's instructions Make sure that you understand and can help the patient follow any healthcare provider instructions for all care.  Provide for the patient's basic needs You should help the patient with basic needs in the home and provide support for getting groceries, prescriptions, and other personal needs.  Monitor the patient's symptoms If they are getting sicker, call his or her medical provider and tell them that the patient has, or is being evaluated for, COVID-19 infection. This will help the healthcare provider's  office take steps to keep other people from getting infected. Ask the healthcare provider to call the local or state health department.  Limit the number of people who have contact with the patient  If possible, have only one caregiver for the patient.  Other  household members should stay in another home or place of residence. If this is not possible, they should stay  in another room, or be separated from the patient as much as possible. Use a separate bathroom, if available.  Restrict visitors who do not have an essential need to be in the home.  Keep older adults, very young children, and other sick people away from the patient Keep older adults, very young children, and those who have compromised immune systems or chronic health conditions away from the patient. This includes people with chronic heart, lung, or kidney conditions, diabetes, and cancer.  Ensure good ventilation Make sure that shared spaces in the home have good air flow, such as from an air conditioner or an opened window, weather permitting.  Wash your hands often  Wash your hands often and thoroughly with soap and water for at least 20 seconds. You can use an alcohol based hand sanitizer if soap and water are not available and if your hands are not visibly dirty.  Avoid touching your eyes, nose, and mouth with unwashed hands.  Use disposable paper towels to dry your hands. If not available, use dedicated cloth towels and replace them when they become wet.  Wear a facemask and gloves  Wear a disposable facemask at all times in the room and gloves when you touch or have contact with the patient's blood, body fluids, and/or secretions or excretions, such as sweat, saliva, sputum, nasal mucus, vomit, urine, or feces.  Ensure the mask fits over your nose and mouth tightly, and do not touch it during use.  Throw out disposable facemasks and gloves after using them. Do not reuse.  Wash your hands immediately after removing your facemask and gloves.  If your personal clothing becomes contaminated, carefully remove clothing and launder. Wash your hands after handling contaminated clothing.  Place all used disposable facemasks, gloves, and other waste in a lined container before  disposing them with other household waste.  Remove gloves and wash your hands immediately after handling these items.  Do not share dishes, glasses, or other household items with the patient  Avoid sharing household items. You should not share dishes, drinking glasses, cups, eating utensils, towels, bedding, or other items with a patient who is confirmed to have, or being evaluated for, COVID-19 infection.  After the person uses these items, you should wash them thoroughly with soap and water.  Wash laundry thoroughly  Immediately remove and wash clothes or bedding that have blood, body fluids, and/or secretions or excretions, such as sweat, saliva, sputum, nasal mucus, vomit, urine, or feces, on them.  Wear gloves when handling laundry from the patient.  Read and follow directions on labels of laundry or clothing items and detergent. In general, wash and dry with the warmest temperatures recommended on the label.  Clean all areas the individual has used often  Clean all touchable surfaces, such as counters, tabletops, doorknobs, bathroom fixtures, toilets, phones, keyboards, tablets, and bedside tables, every day. Also, clean any surfaces that may have blood, body fluids, and/or secretions or excretions on them.  Wear gloves when cleaning surfaces the patient has come in contact with.  Use a diluted bleach solution (e.g., dilute bleach with 1  part bleach and 10 parts water) or a household disinfectant with a label that says EPA-registered for coronaviruses. To make a bleach solution at home, add 1 tablespoon of bleach to 1 quart (4 cups) of water. For a larger supply, add  cup of bleach to 1 gallon (16 cups) of water.  Read labels of cleaning products and follow recommendations provided on product labels. Labels contain instructions for safe and effective use of the cleaning product including precautions you should take when applying the product, such as wearing gloves or eye protection  and making sure you have good ventilation during use of the product.  Remove gloves and wash hands immediately after cleaning.  Monitor yourself for signs and symptoms of illness Caregivers and household members are considered close contacts, should monitor their health, and will be asked to limit movement outside of the home to the extent possible. Follow the monitoring steps for close contacts listed on the symptom monitoring form.   ? If you have additional questions, contact your local health department or call the epidemiologist on call at [phone] (available 24/7). ? This guidance is subject to change. For the most up-to-date guidance from [facility], please refer to their website: [email or web address]

## 2019-09-10 NOTE — Plan of Care (Signed)
Notified by lab of critical lab value of Calcium 6.0. Text to MD via Web link.

## 2019-09-10 NOTE — Discharge Summary (Addendum)
Julian Golden, is a 84 y.o. male  DOB 1936-05-11  MRN [id number].  Admission date:  09/06/2019  Admitting Physician  Clydie Braunondell A Smith, MD  Discharge Date:  09/10/2019   Primary MD  Olive Bassough, Robert L, MD   Addendum: -It was evaluated by PT today, and recommendation has been changed from home health to SNF, have discussed with patient and daughter about SNF discharge, they have declined, and they would rather patient go home with home health, so we will go ahead and proceed with initial discharge plan..   Recommendations for primary care physician for things to follow:  -Aspirin has been held on discharge, resume in 2 weeks if CBC remains stable. -Patient will need outpatient GI follow-up for further work-up for presumed GI bleed, and colonoscopy as well , as per family patient never had screening colonoscopy . -Resume antihypertensive regimen if blood pressure started to increase.  -Patient will need outpatient vascular surgery referral for finding of 4.4 infrarenal abdominal aortic aneurysm on CT imaging  Admission Diagnosis  Pneumonia due to COVID-19 virus [U07.1, J12.82]  Discharge Diagnosis  Pneumonia due to COVID-19 virus [U07.1, J12.82]   Principal Problem:   GI bleed Active Problems:   Pneumonia due to COVID-19 virus   AKI (acute kidney injury) (HCC)   Essential hypertension   Diabetes mellitus type 2 in obese (HCC)   AAA (abdominal aortic aneurysm) (HCC)   DNR (do not resuscitate)   Acute blood loss anemia      Past Medical History:  Diagnosis Date  . DM type 2 (diabetes mellitus, type 2) (HCC)   . HTN (hypertension)   . Hyperlipemia   . Obesity     History reviewed. No pertinent surgical history.     History of present illness and  Hospital Course:     Kindly see H&P for history of present illness and admission details, please review complete Labs, Consult reports and Test reports for  all details in brief  HPI  from the history and physical done on the day of admission 09/06/2019   HPI: Julian Golden is a 84 y.o. male without reported significant past medical history hypertension, hyperlipidemia, and diabetes mellitus type 2 presented initially to [facility] on 4/7 with reports of weakness.  History is obtained mostly from review of records and in talks with the patient.  In triage patient had portably syncopized and was pale and unresponsive.  His daughter reported that he had said that he was ready to die for the last 2 years.  He was reported to not be on any blood thinners, but have reportedly been having black diarrhea for the last 5 days.  The patient was noted to be afebrile, pulse 123, respirations 44, blood pressure 93/50, and O2 saturation 89% on room air.  He was placed on 2 L of cannula oxygen to maintain O2 saturations.  Labs revealed WBC 10.7, hemoglobin 14.9, platelets 202, BUN 43, Cr 2.1, Lactic acid 5.9, procalcitonin 0.81, CRP 79. CXR revealed a multifocal pna. Covid pcr  was positive. 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  CT scan of thee chest, abdomen, and pelvis revealed patchy bilateral opacities in the mid and lower lungs, aortic atherosclerosis with a 4.4 cm infrarenal renal abdominal aortic aneurysm.  He has been treated with IV fluids, Protonix drip, Decadron, remdesivir.  Repeat labs on 4/8 revealed hemoglobin dropped down to 11.6, BUN 36, creatinine 1.6, and lactic acid was clearing.  Transfer had been requested for the possible need of GI consultative services. Patient denies any use of any NSAIDs because it messes up his stomach. He does take a daily aspirin.    Hospital Course    GI bleed, Acuteblood loss anemia:  - Patient presents with complaints of dark stools and weakness. Stool guaiacs were noted to be positive at the outside facility with initial hemoglobin  14.9->11.6. e elevated BUN suggest possible upper GI bleed. -Admitting physician discussed with GI, given stable hemoglobin and positive COVID-19 status, no work-up is currently indicated as long hemoglobin remained stable, further work-up would be indicated as an outpatient. -No recurrence of GI bleed during hospital stay, patient had multiple bowel movements with normal colored stools, hemoglobin remained stable, it is 10.6 on discharge. -Hold aspirin on discharge, resume in 2 weeks if hemoglobin remained stable. -Treated With Protonix drip initially, he will be discharged on Protonix 40 mg oral twice daily. -Did not follow with GI physician in the past, never had endoscopy or colonoscopy, I have discussed with daughter importance of outpatient GI follow-up especially for screening colonoscopy after he recovers from Covid.  Acute respiratory failure with hypoxia secondary to pneumonia due to COVID-19: -Chest x-ray significant for multifocal opacity.  It was some oxygen requirement initially, but he remained on room air for last 3 days with no oxygen requirement, he was treated with steroids, and IV remdesivir, the 5 days of oral Decadron as an outpatient. -Elevated procalcitonin 0.8 at [facility] ED, treated with IV Rocephin and azithromycin during hospital stays patient with elevated lactic acid 5.9 on admission, to finish another 3 days of Augmentin as an outpatient .  Acute kidney injury:  - Patient baseline creatinine unknown. At the outside facility patient's creatinine was noted to be 1.9 with BUN 40. -Improving with IV fluids, resolved  ?COPD -Continue trilogyElliptia  Diabetes mellitus type 2: Home medications include Metformin 1000 mg twice daily. -Continue home regimen on discharge  Essential hypertension: - Patient had initially been noted to be hypotensive. Blood pressures currently maintained. Homeblood pressure medications include  irbesartan-hydrochlorothiazide300-12.5mg  dailyand spironolactone 25 mg daily. -Hold blood pressure medications on discharge given acceptable blood pressure medicine during hospital stay with no medications, this can be resumed as an outpatient blood pressure started to increase.  AAA: Patient noted to have a 4.4 infrarenal abdominal aortic aneurysm on CT imaging at outside facility. -Recommending outpatient follow-up with rep   Discharge Condition: Stable D/W daughter  Follow UP  Follow-up Information    Dough, Doris Cheadle, MD Follow up in 2 week(s).   Specialty: Family Medicine Contact information: [address] [place] [address] [phone]             Discharge Instructions  and  Discharge Medications    Discharge Instructions    Discharge instructions   Complete by: As directed    Follow with Primary MD Sol Passer Doris Cheadle, MD in 10 days   Get CBC, CMP, 2 view Chest X ray checked  by Primary MD next  visit.    Activity: As tolerated with Full fall precautions use walker/cane & assistance as needed   Disposition Home    Diet: Heart Healthy / Carb modified , with feeding assistance and aspiration precautions.  For Heart failure patients - Check your Weight same time everyday, if you gain over 2 pounds, or you develop in leg swelling, experience more shortness of breath or chest pain, call your Primary MD immediately. Follow Cardiac Low Salt Diet and 1.5 lit/day fluid restriction.   On your next visit with your primary care physician please Get Medicines reviewed and adjusted.   Please request your Prim.MD to go over all Hospital Tests and Procedure/Radiological results at the follow up, please get all Hospital records sent to your Prim MD by signing hospital release before you go home.   If you experience worsening of your admission symptoms, develop shortness of breath, life threatening emergency, suicidal or homicidal thoughts you must seek medical  attention immediately by calling 911 or calling your MD immediately  if symptoms less severe.  You Must read complete instructions/literature along with all the possible adverse reactions/side effects for all the Medicines you take and that have been prescribed to you. Take any new Medicines after you have completely understood and accpet all the possible adverse reactions/side effects.   Do not drive, operating heavy machinery, perform activities at heights, swimming or participation in water activities or provide baby sitting services if your were admitted for syncope or siezures until you have seen by Primary MD or a Neurologist and advised to do so again.  Do not drive when taking Pain medications.    Do not take more than prescribed Pain, Sleep and Anxiety Medications  Special Instructions: If you have smoked or chewed Tobacco  in the last 2 yrs please stop smoking, stop any regular Alcohol  and or any Recreational drug use.  Wear Seat belts while driving.   Please note  You were cared for by a hospitalist during your hospital stay. If you have any questions about your discharge medications or the care you received while you were in the hospital after you are discharged, you can call the unit and asked to speak with the hospitalist on call if the hospitalist that took care of you is not available. Once you are discharged, your primary care physician will handle any further medical issues. Please note that NO REFILLS for any discharge medications will be authorized once you are discharged, as it is imperative that you return to your primary care physician (or establish a relationship with a primary care physician if you do not have one) for your aftercare needs so that they can reassess your need for medications and monitor your lab values.   Increase activity slowly   Complete by: As directed    MyChart COVID-19 home monitoring program   Complete by: Sep 10, 2019    Is the patient willing  to use the [facility] for home monitoring?: Yes   Temperature monitoring   Complete by: Sep 10, 2019    After how many days would you like to receive a notification of this patient's flowsheet entries?: 1     Allergies as of 09/10/2019   No Known Allergies     Medication List    STOP taking these medications   aspirin EC 81 MG tablet   irbesartan-hydrochlorothiazide 300-12.5 MG tablet Commonly known as: AVALIDE   spironolactone 25 MG tablet Commonly known as: ALDACTONE  TAKE these medications   acetaminophen 325 MG tablet Commonly known as: TYLENOL Take 2 tablets (650 mg total) by mouth every 6 (six) hours as needed for mild pain or headache (fever >/= 101).   amoxicillin-clavulanate 875-125 MG tablet Commonly known as: Augmentin Take 1 tablet by mouth 2 (two) times daily for 3 days. Start taking on: September 11, 2019   atorvastatin 80 MG tablet Commonly known as: LIPITOR Take 80 mg by mouth daily.   calcium carbonate 500 MG chewable tablet Commonly known as: TUMS - dosed in mg elemental calcium Chew 1 tablet (200 mg of elemental calcium total) by mouth 2 (two) times daily with a meal for 14 days.   dexamethasone 6 MG tablet Commonly known as: DECADRON Take 1 tablet (6 mg total) by mouth daily. Start taking on: September 11, 2019   metFORMIN 500 MG 24 hr tablet Commonly known as: GLUCOPHAGE-XR Take 1,000 mg by mouth 2 (two) times daily.   pantoprazole 40 MG tablet Commonly known as: PROTONIX Take 1 tablet (40 mg total) by mouth 2 (two) times daily.   Trelegy Ellipta 100-62.5-25 MCG/INH Aepb Generic drug: Fluticasone-Umeclidin-Vilant Inhale 1 puff into the lungs daily.   Ventolin HFA 108 (90 Base) MCG/ACT inhaler Generic drug: albuterol Inhale 2 puffs into the lungs every 6 (six) hours as needed for wheezing or shortness of breath.         Diet and Activity recommendation: See Discharge Instructions above   Consults obtained -  None   Major  procedures and Radiology Reports - PLEASE review detailed and final reports for all details, in brief -     DG Chest Port 1 View  Result Date: 09/07/2019 CLINICAL DATA:  COVID-19 positive EXAM: PORTABLE CHEST 1 VIEW COMPARISON:  September 05, 2019 FINDINGS: There is ill-defined opacity in the left mid lung and in each lower lung region with small left pleural effusion. No consolidation. There is cardiomegaly with pulmonary vascularity normal. No adenopathy. There is aortic atherosclerosis. No bone lesions. IMPRESSION: Ill-defined opacity in the left mid lung and both lung bases consistent with multifocal pneumonia. Overall increase in opacity compared to 2 days prior. Small left pleural effusion. Stable cardiac prominence. No adenopathy demonstrable. Aortic Atherosclerosis (ICD10-I70.0). Electronically Signed   By: Bretta Bang III M.D.   On: 09/07/2019 09:51    Micro Results    No results found for this or any previous visit (from the past 240 hour(s)).     Today   Subjective:   Julian Golden today has no headache,no chest or abdominal pain,no new weakness tingling or numbness, feels much better wants to go home today.   Objective:   Blood pressure 131/72, pulse 79, temperature 97.9 F (36.6 C), temperature source Oral, resp. rate (!) 25, SpO2 96 %.   Intake/Output Summary (Last 24 hours) at 09/10/2019 1118 Last data filed at 09/10/2019 0400 Gross per 24 hour  Intake 2059.76 ml  Output 1200 ml  Net 859.76 ml    Exam Awake Alert, Oriented x 3, No new F.N deficits, Normal affect Patient is legally blind Symmetrical Chest wall movement, Good air movement bilaterally, CTAB RRR,No Gallops,Rubs or new Murmurs, No Parasternal Heave +ve B.Sounds, Abd Soft, Non tender,  No rebound -guarding or rigidity. No Cyanosis, Clubbing or edema, No new Rash or bruise  Data Review   CBC w Diff:  Lab Results  Component Value Date   WBC 5.7 09/10/2019   HGB 10.6 (L) 09/10/2019   HCT 31.2 (L)  09/10/2019  PLT 129 (L) 09/10/2019   LYMPHOPCT 10 09/10/2019   MONOPCT 7 09/10/2019   EOSPCT 0 09/10/2019   BASOPCT 0 09/10/2019    CMP:  Lab Results  Component Value Date   NA 145 09/10/2019   K 3.0 (L) 09/10/2019   CL 118 (H) 09/10/2019   CO2 16 (L) 09/10/2019   BUN 18 09/10/2019   CREATININE 0.80 09/10/2019   PROT 3.8 (L) 09/10/2019   ALBUMIN 1.7 (L) 09/10/2019   BILITOT 0.6 09/10/2019   ALKPHOS 36 (L) 09/10/2019   AST 26 09/10/2019   ALT 20 09/10/2019  .   Total Time in preparing paper work, data evaluation and todays exam - 35 minutes  Huey Bienenstock M.D on 09/10/2019 at 11:18 AM  [facility]   Office  [phone]

## 2019-09-11 DIAGNOSIS — U071 COVID-19: Secondary | ICD-10-CM | POA: Diagnosis not present

## 2019-09-11 DIAGNOSIS — N179 Acute kidney failure, unspecified: Secondary | ICD-10-CM | POA: Diagnosis not present

## 2019-09-11 DIAGNOSIS — K922 Gastrointestinal hemorrhage, unspecified: Secondary | ICD-10-CM | POA: Diagnosis not present

## 2019-09-11 DIAGNOSIS — D62 Acute posthemorrhagic anemia: Secondary | ICD-10-CM | POA: Diagnosis not present

## 2019-09-11 LAB — BASIC METABOLIC PANEL
Anion gap: 10 (ref 5–15)
BUN: 21 mg/dL (ref 8–23)
CO2: 21 mmol/L — ABNORMAL LOW (ref 22–32)
Calcium: 7.3 mg/dL — ABNORMAL LOW (ref 8.9–10.3)
Chloride: 112 mmol/L — ABNORMAL HIGH (ref 98–111)
Creatinine, Ser: 1.13 mg/dL (ref 0.61–1.24)
GFR calc Af Amer: >60 mL/min (ref >60)
GFR calc non Af Amer: >60 mL/min (ref >60)
Glucose, Bld: 150 mg/dL — ABNORMAL HIGH (ref 70–99)
Potassium: 3.4 mmol/L — ABNORMAL LOW (ref 3.5–5.1)
Sodium: 143 mmol/L (ref 135–145)

## 2019-09-11 LAB — CBC
HCT: 34.8 % — ABNORMAL LOW (ref 39.0–52.0)
Hemoglobin: 11.6 g/dL — ABNORMAL LOW (ref 13.0–17.0)
MCH: 31.6 pg (ref 26.0–34.0)
MCHC: 33.3 g/dL (ref 30.0–36.0)
MCV: 94.8 fL (ref 80.0–100.0)
Platelets: 147 10*3/uL — ABNORMAL LOW (ref 150–400)
RBC: 3.67 MIL/uL — ABNORMAL LOW (ref 4.22–5.81)
RDW: 15.2 % (ref 11.5–15.5)
WBC: 6.7 10*3/uL (ref 4.0–10.5)
nRBC: 0 % (ref 0.0–0.2)

## 2019-09-11 LAB — GLUCOSE, CAPILLARY
Glucose-Capillary: 125 mg/dL — ABNORMAL HIGH (ref 70–99)
Glucose-Capillary: 102 mg/dL — ABNORMAL HIGH (ref 70–99)

## 2019-09-11 MED ORDER — POTASSIUM CHLORIDE CRYS ER 20 MEQ PO TBCR
40.0000 meq | EXTENDED_RELEASE_TABLET | Freq: Once | ORAL | Status: AC
  Administered 2019-09-11: 40 meq via ORAL
  Filled 2019-09-11: qty 2

## 2019-09-11 MED ORDER — FUROSEMIDE 10 MG/ML IJ SOLN
40.0000 mg | Freq: Once | INTRAMUSCULAR | Status: AC
  Administered 2019-09-11: 40 mg via INTRAVENOUS
  Filled 2019-09-11: qty 4

## 2019-09-11 MED ORDER — FUROSEMIDE 20 MG PO TABS: 20.0000 mg | ORAL_TABLET | Freq: Every day | ORAL | 0 refills | Status: AC

## 2019-09-11 NOTE — Progress Notes (Signed)
Julian Golden to be D/C'd Home per MD order. Discussed with the patient and all questions fully answered.  Allergies as of 09/11/2019   No Known Allergies     Medication List    STOP taking these medications   aspirin EC 81 MG tablet   irbesartan-hydrochlorothiazide 300-12.5 MG tablet Commonly known as: AVALIDE   spironolactone 25 MG tablet Commonly known as: ALDACTONE     TAKE these medications   acetaminophen 325 MG tablet Commonly known as: TYLENOL Take 2 tablets (650 mg total) by mouth every 6 (six) hours as needed for mild pain or headache (fever >/= 101).   amoxicillin-clavulanate 875-125 MG tablet Commonly known as: Augmentin Take 1 tablet by mouth 2 (two) times daily for 3 days.   atorvastatin 80 MG tablet Commonly known as: LIPITOR Take 80 mg by mouth daily.   calcium carbonate 500 MG chewable tablet Commonly known as: TUMS - dosed in mg elemental calcium Chew 1 tablet (200 mg of elemental calcium total) by mouth 2 (two) times daily with a meal for 14 days.   dexamethasone 6 MG tablet Commonly known as: DECADRON Take 1 tablet (6 mg total) by mouth daily.   furosemide 20 MG tablet Commonly known as: Lasix Take 1 tablet (20 mg total) by mouth daily.   metFORMIN 500 MG 24 hr tablet Commonly known as: GLUCOPHAGE-XR Take 1,000 mg by mouth 2 (two) times daily.   pantoprazole 40 MG tablet Commonly known as: PROTONIX Take 1 tablet (40 mg total) by mouth 2 (two) times daily.   Trelegy Ellipta 100-62.5-25 MCG/INH Aepb Generic drug: Fluticasone-Umeclidin-Vilant Inhale 1 puff into the lungs daily.   Ventolin HFA 108 (90 Base) MCG/ACT inhaler Generic drug: albuterol Inhale 2 puffs into the lungs every 6 (six) hours as needed for wheezing or shortness of breath.       VVS, Skin clean, dry and intact without evidence of skin break down, no evidence of skin tears noted.  IV catheter discontinued intact. Site without signs and symptoms of complications. Dressing and  pressure applied.  An After Visit Summary was printed and given to the patient.  Patient escorted via WC, and D/C home via private auto.  Julian Golden  09/11/2019 3:32 PM

## 2019-09-11 NOTE — TOC Transition Note (Signed)
Transition of Care [facility]) - CM/SW Discharge Note   Patient Details  Name: Eden Rho MRN: [id number] Date of Birth: 1936-01-23  Transition of [facility] CM/SW Contact:  Cherylann Parr, RN Phone Number: 09/11/2019, 2:44 PM   Clinical Narrative:   Pt deemed stable for discharge today. CM aware pt will discharge home today. No other CM needs determined - CM signing off    Final next level of care: Home w Home Health Services Barriers to Discharge: Barriers Resolved   Patient Goals and CMS Choice Patient states their goals for this hospitalization and ongoing recovery are:: Pt states he is ready to get back home be independent CMS Medicare.gov Compare Post Acute Care list provided to:: Patient Choice offered to / list presented to : Patient  Discharge Placement                       Discharge Plan and Services                          HH Arranged: PT, OT, RN [facility] Agency: [facility] Date [facility] Agency Contacted: 09/10/19 Time HH Agency Contacted: [phone]    Social Determinants of Health (SDOH) Interventions     Readmission Risk Interventions No flowsheet data found.

## 2019-09-11 NOTE — Consult Note (Signed)
Telepsych Consultation   Reason for Consult:  "suicidal ideation" Referring Physician:  Dr Randol KernElgergawy [id number] Location of Provider: [facility]  Patient Identification: Julian Golden MRN:  [id number] Principal Diagnosis: GI bleed Diagnosis:  Principal Problem:   GI bleed Active Problems:   Pneumonia due to COVID-19 virus   AKI (acute kidney injury) (HCC)   Essential hypertension   Diabetes mellitus type 2 in obese (HCC)   AAA (abdominal aortic aneurysm) (HCC)   DNR (do not resuscitate)   Acute blood loss anemia   Total Time spent with patient: 30 minutes  Subjective:   Julian Golden is a 84 y.o. male patient.  Patient alert and oriented, answers appropriately. Patient states "they say I have got that corona but I want to go home." Patient denies suicidal ideations.  Denies history of suicide attempts, denies history of self-harm.  Patient states "I always say I want to shoot myself, but I never want to shoot myself." Patient denies homicidal ideations.  Patient denies auditory visual hallucinations.  Patient denies symptoms of paranoia.  Patient does not appear to be responding to internal stimuli. Patient patient lives alone in [place].  Patient reports 2 daughters and 2 sons who come by to assist him daily.  Patient denies access to weapons.  Patient denies alcohol and substance use. Patient gives verbal consent to speak with daughter, Vilinda FlakeWanda Voncannon. Spoke with patient's daughter, Burna MortimerWanda:  Patient's daughter denies concerns for patient safety.  Patient's daughter denies access to weapons.  Patient's daughter states "he basically just had a fit like a child because they told him he would be able to go home yesterday and then he did not." Patient's daughter reports plan to pick up patient once ready for discharge.  HPI: Patient [facility] complaints of weakness.  Past Psychiatric History: Denies  Risk to Self:  Denies Risk  to Others:  Denies Prior Inpatient Therapy:  Denies Prior Outpatient Therapy:  Denies  Past Medical History:  Past Medical History:  Diagnosis Date  . DM type 2 (diabetes mellitus, type 2) (HCC)   . HTN (hypertension)   . Hyperlipemia   . Obesity    History reviewed. No pertinent surgical history. Family History: History reviewed. No pertinent family history. Family Psychiatric  History: Denies Social History:  Social History   Substance and Sexual Activity  Alcohol Use None     Social History   Substance and Sexual Activity  Drug Use Not on file    Social History   Socioeconomic History  . Marital status: Widowed    Spouse name: Not on file  . Number of children: Not on file  . Years of education: Not on file  . Highest education level: Not on file  Occupational History  . Not on file  Tobacco Use  . Smoking status: Not on file  Substance and Sexual Activity  . Alcohol use: Not on file  . Drug use: Not on file  . Sexual activity: Not on file  Other Topics Concern  . Not on file  Social History Narrative  . Not on file   Social Determinants of Health   Financial Resource Strain:   . Difficulty of Paying Living Expenses:   Food Insecurity:   . Worried About Programme researcher, broadcasting/film/videounning Out of Food in the Last Year:   . Baristaan Out of Food in the Last Year:   Transportation Needs:   . Freight forwarderLack of Transportation (Medical):   Marland Kitchen. Lack of  Transportation (Non-Medical):   Physical Activity:   . Days of Exercise per Week:   . Minutes of Exercise per Session:   Stress:   . Feeling of Stress :   Social Connections:   . Frequency of Communication with Friends and Family:   . Frequency of Social Gatherings with Friends and Family:   . Attends Religious Services:   . Active Member of Clubs or Organizations:   . Attends Banker Meetings:   Marland Kitchen Marital Status:    Additional Social History:    Allergies:  No Known Allergies  Labs:  Results for orders placed or performed during  the hospital encounter of 09/06/19 (from the past 48 hour(s))  Glucose, capillary     Status: Abnormal   Collection Time: 09/09/19  5:06 PM  Result Value Ref Range   Glucose-Capillary 172 (H) 70 - 99 mg/dL    Comment: Glucose reference range applies only to samples taken after fasting for at least 8 hours.  Glucose, capillary     Status: Abnormal   Collection Time: 09/09/19  9:13 PM  Result Value Ref Range   Glucose-Capillary 153 (H) 70 - 99 mg/dL    Comment: Glucose reference range applies only to samples taken after fasting for at least 8 hours.  CBC with Differential/Platelet     Status: Abnormal   Collection Time: 09/10/19  3:31 AM  Result Value Ref Range   WBC 5.7 4.0 - 10.5 K/uL   RBC 3.32 (L) 4.22 - 5.81 MIL/uL   Hemoglobin 10.6 (L) 13.0 - 17.0 g/dL   HCT 77.9 (L) 39.0 - 30.0 %   MCV 94.0 80.0 - 100.0 fL   MCH 31.9 26.0 - 34.0 pg   MCHC 34.0 30.0 - 36.0 g/dL   RDW 92.3 30.0 - 76.2 %   Platelets 129 (L) 150 - 400 K/uL   nRBC 0.0 0.0 - 0.2 %   Neutrophils Relative % 82 %   Neutro Abs 4.6 1.7 - 7.7 K/uL   Lymphocytes Relative 10 %   Lymphs Abs 0.6 (L) 0.7 - 4.0 K/uL   Monocytes Relative 7 %   Monocytes Absolute 0.4 0.1 - 1.0 K/uL   Eosinophils Relative 0 %   Eosinophils Absolute 0.0 0.0 - 0.5 K/uL   Basophils Relative 0 %   Basophils Absolute 0.0 0.0 - 0.1 K/uL   Smear Review PLATELETS APPEAR ADEQUATE    Immature Granulocytes 1 %   Abs Immature Granulocytes 0.05 0.00 - 0.07 K/uL    Comment: Performed at [facility], [address], [place], [place] [address]  Comprehensive metabolic panel     Status: Abnormal   Collection Time: 09/10/19  3:31 AM  Result Value Ref Range   Sodium 145 135 - 145 mmol/L   Potassium 3.0 (L) 3.5 - 5.1 mmol/L   Chloride 118 (H) 98 - 111 mmol/L   CO2 16 (L) 22 - 32 mmol/L   Glucose, Bld 128 (H) 70 - 99 mg/dL    Comment: Glucose reference range applies only to samples taken after fasting for at least 8 hours.   BUN 18 8 - 23 mg/dL    Creatinine, Ser 5.45 0.61 - 1.24 mg/dL   Calcium 6.0 (LL) 8.9 - 10.3 mg/dL    Comment: CRITICAL RESULT CALLED TO, READ BACK BY AND VERIFIED WITH: Darl Pikes [facility] 09/10/19 0503 WAYK    Total Protein 3.8 (L) 6.5 - 8.1 g/dL   Albumin 1.7 (L) 3.5 - 5.0 g/dL  AST 26 15 - 41 U/L   ALT 20 0 - 44 U/L   Alkaline Phosphatase 36 (L) 38 - 126 U/L   Total Bilirubin 0.6 0.3 - 1.2 mg/dL   GFR calc non Af Amer >60 >60 mL/min   GFR calc Af Amer >60 >60 mL/min   Anion gap 11 5 - 15    Comment: Performed at [facility], [address], [place], [place] [address]  Glucose, capillary     Status: Abnormal   Collection Time: 09/10/19  7:54 AM  Result Value Ref Range   Glucose-Capillary 112 (H) 70 - 99 mg/dL    Comment: Glucose reference range applies only to samples taken after fasting for at least 8 hours.  Glucose, capillary     Status: Abnormal   Collection Time: 09/10/19 12:56 PM  Result Value Ref Range   Glucose-Capillary 110 (H) 70 - 99 mg/dL    Comment: Glucose reference range applies only to samples taken after fasting for at least 8 hours.  Glucose, capillary     Status: Abnormal   Collection Time: 09/10/19  4:51 PM  Result Value Ref Range   Glucose-Capillary 152 (H) 70 - 99 mg/dL    Comment: Glucose reference range applies only to samples taken after fasting for at least 8 hours.  Glucose, capillary     Status: Abnormal   Collection Time: 09/10/19  8:36 PM  Result Value Ref Range   Glucose-Capillary 150 (H) 70 - 99 mg/dL    Comment: Glucose reference range applies only to samples taken after fasting for at least 8 hours.  CBC     Status: Abnormal   Collection Time: 09/11/19  4:00 AM  Result Value Ref Range   WBC 6.7 4.0 - 10.5 K/uL   RBC 3.67 (L) 4.22 - 5.81 MIL/uL   Hemoglobin 11.6 (L) 13.0 - 17.0 g/dL   HCT 62.1 (L) 30.8 - 65.7 %   MCV 94.8 80.0 - 100.0 fL   MCH 31.6 26.0 - 34.0 pg   MCHC 33.3 30.0 - 36.0 g/dL   RDW 84.6 96.2 - 95.2 %   Platelets 147 (L) 150 - 400 K/uL   nRBC  0.0 0.0 - 0.2 %    Comment: Performed at [facility], [address] [place], [place] [address]  Basic metabolic panel     Status: Abnormal   Collection Time: 09/11/19  4:00 AM  Result Value Ref Range   Sodium 143 135 - 145 mmol/L   Potassium 3.4 (L) 3.5 - 5.1 mmol/L   Chloride 112 (H) 98 - 111 mmol/L   CO2 21 (L) 22 - 32 mmol/L   Glucose, Bld 150 (H) 70 - 99 mg/dL    Comment: Glucose reference range applies only to samples taken after fasting for at least 8 hours.   BUN 21 8 - 23 mg/dL   Creatinine, Ser 4.40 0.61 - 1.24 mg/dL   Calcium 7.3 (L) 8.9 - 10.3 mg/dL   GFR calc non Af Amer >60 >60 mL/min   GFR calc Af Amer >60 >60 mL/min   Anion gap 10 5 - 15    Comment: Performed at [facility], [address], [place], [place] [address]  Glucose, capillary     Status: Abnormal   Collection Time: 09/11/19  7:32 AM  Result Value Ref Range   Glucose-Capillary 125 (H) 70 - 99 mg/dL    Comment: Glucose reference range applies only to samples taken after  fasting for at least 8 hours.    Medications:  Current Facility-Administered Medications  Medication Dose Route Frequency Provider Last Rate Last Admin  . acetaminophen (TYLENOL) tablet 650 mg  650 mg Oral Q6H PRN Madelyn Flavors A, MD   650 mg at 09/08/19 0915  . albuterol (VENTOLIN HFA) 108 (90 Base) MCG/ACT inhaler 2 puff  2 puff Inhalation Q6H Madelyn Flavors A, MD   2 puff at 09/11/19 0916  . ascorbic acid (VITAMIN C) tablet 500 mg  500 mg Oral Daily Katrinka Blazing, Rondell A, MD   500 mg at 09/11/19 0916  . atorvastatin (LIPITOR) tablet 80 mg  80 mg Oral Daily Smith, Rondell A, MD   80 mg at 09/10/19 1805  . azithromycin (ZITHROMAX) 500 mg in sodium chloride 0.9 % 250 mL IVPB  500 mg Intravenous Q24H Elgergawy, Leana Roe, MD 250 mL/hr at 09/10/19 1545 500 mg at 09/10/19 1545  . calcium carbonate (TUMS - dosed in mg elemental calcium) chewable tablet 200 mg of elemental calcium  1 tablet Oral BID WC Elgergawy, Leana Roe, MD   200 mg of  elemental calcium at 09/11/19 0916  . cefTRIAXone (ROCEPHIN) 1 g in sodium chloride 0.9 % 100 mL IVPB  1 g Intravenous Q24H Elgergawy, Leana Roe, MD 200 mL/hr at 09/10/19 1439 1 g at 09/10/19 1439  . Chlorhexidine Gluconate Cloth 2 % PADS 6 each  6 each Topical Daily Clydia Llano, MD   6 each at 09/11/19 [phone]  . chlorpheniramine-HYDROcodone (TUSSIONEX) 10-8 MG/5ML suspension 5 mL  5 mL Oral Q12H PRN Madelyn Flavors A, MD   5 mL at 09/08/19 2020  . dexamethasone (DECADRON) tablet 6 mg  6 mg Oral Q24H Smith, Rondell A, MD   6 mg at 09/10/19 1313  . fluticasone furoate-vilanterol (BREO ELLIPTA) 100-25 MCG/INH 1 puff  1 puff Inhalation Daily Pierce, Dwayne A, RPH   1 puff at 09/11/19 0920   And  . umeclidinium bromide (INCRUSE ELLIPTA) 62.5 MCG/INH 1 puff  1 puff Inhalation Daily Lodema Hong A, RPH   1 puff at 09/11/19 0920  . guaiFENesin-dextromethorphan (ROBITUSSIN DM) 100-10 MG/5ML syrup 10 mL  10 mL Oral Q4H PRN Madelyn Flavors A, MD   10 mL at 09/11/19 0922  . insulin aspart (novoLOG) injection 0-5 Units  0-5 Units Subcutaneous QHS Smith, Rondell A, MD      . insulin aspart (novoLOG) injection 0-9 Units  0-9 Units Subcutaneous TID WC Clydie Braun, MD   1 Units at 09/11/19 0917  . loperamide (IMODIUM) capsule 2 mg  2 mg Oral PRN Elgergawy, Leana Roe, MD   2 mg at 09/07/19 1813  . ondansetron (ZOFRAN) tablet 4 mg  4 mg Oral Q6H PRN Madelyn Flavors A, MD       Or  . ondansetron (ZOFRAN) injection 4 mg  4 mg Intravenous Q6H PRN Smith, Rondell A, MD      . pantoprazole (PROTONIX) EC tablet 40 mg  40 mg Oral BID Elgergawy, Leana Roe, MD   40 mg at 09/11/19 0919  . sodium chloride flush (NS) 0.9 % injection 10-40 mL  10-40 mL Intracatheter Q12H Elgergawy, Leana Roe, MD   10 mL at 09/11/19 0919  . sodium chloride flush (NS) 0.9 % injection 10-40 mL  10-40 mL Intracatheter PRN Elgergawy, Leana Roe, MD      . sodium chloride flush (NS) 0.9 % injection 3 mL  3 mL Intravenous Q12H Smith, Rondell A, MD   3  mL at  09/09/19 2148  . zinc sulfate capsule 220 mg  220 mg Oral Daily Fuller Plan A, MD   220 mg at 09/11/19 0919    Musculoskeletal: Strength & Muscle Tone: within normal limits Gait & Station: unable to assess Patient leans: N/A  Psychiatric Specialty Exam: Physical Exam  Nursing note and vitals reviewed. Constitutional: He is oriented to person, place, and time. He appears well-developed.  HENT:  Head: Normocephalic.  Cardiovascular: Normal rate.  Respiratory: Effort normal.  Musculoskeletal:        General: Normal range of motion.     Cervical back: Normal range of motion.  Neurological: He is alert and oriented to person, place, and time.  Psychiatric: He has a normal mood and affect. His behavior is normal. Judgment and thought content normal.    Review of Systems  Constitutional: Negative.   HENT: Negative.   Eyes: Negative.   Respiratory: Negative.   Cardiovascular: Negative.   Gastrointestinal: Negative.   Genitourinary: Negative.   Musculoskeletal: Negative.   Skin: Negative.   Neurological: Negative.   Psychiatric/Behavioral: Negative.     Blood pressure 136/74, pulse 73, temperature 97.7 F (36.5 C), temperature source Oral, resp. rate (!) 24, SpO2 96 %.There is no height or weight on file to calculate BMI.  General Appearance: Casual  Eye Contact:  Good  Speech:  Clear and Coherent and Normal Rate  Volume:  Normal  Mood:  Euthymic  Affect:  Appropriate and Congruent  Thought Process:  Coherent, Goal Directed and Descriptions of Associations: Intact  Orientation:  Full (Time, Place, and Person)  Thought Content:  WDL and Logical  Suicidal Thoughts:  No  Homicidal Thoughts:  No  Memory:  Immediate;   Good Recent;   Good Remote;   Good  Judgement:  Good  Insight:  Good  Psychomotor Activity:  Normal  Concentration:  Concentration: Good and Attention Span: Good  Recall:  Good  Fund of Knowledge:  Good  Language:  Good  Akathisia:  No  Handed:   Right  AIMS (if indicated):     Assets:  Communication Skills Desire for Improvement Financial Resources/Insurance Housing Intimacy Leisure Time Physical Health Resilience Social Support  ADL's:  Intact  Cognition:  WNL  Sleep:        Treatment Plan Summary: Patient discussed with Dr. Dwyane Dee. Patient cleared by psychiatry.  Disposition: No evidence of imminent risk to self or others at present.   Patient does not meet criteria for psychiatric inpatient admission. Supportive therapy provided about ongoing stressors.  This service was provided via telemedicine using a 2-way, interactive audio and video technology.  Names of all persons participating in this telemedicine service and their role in this encounter. Name: Julian Golden Role: Patient  Name: Gwenith Spitz telephone Role: Patient's daughter  Name: Letitia Libra Role: [place], [place] 09/11/2019 11:02 AM

## 2019-09-11 NOTE — Care Management Important Message (Signed)
Important Message  Patient Details  Name: Julian Golden MRN: [id number] Date of Birth: 09/28/1935   Medicare Important Message Given:  Yes - Important Message mailed due to current National Emergency  Verbal consent obtained due to current National Emergency  Relationship to patient: Child Contact Name: Vilinda Flake Call Date: 09/11/19  Time: 1430 Phone: [phone] Outcome: Spoke with contact Important Message mailed to: Patient address on file    Orson Aloe 09/11/2019, 2:30 PM

## 2019-09-11 NOTE — Discharge Summary (Addendum)
Julian Golden, is a 84 y.o. male  DOB August 29, 1935  MRN [id number].  Admission date:  09/06/2019  Admitting Physician  Clydie Braun, MD  Discharge Date:  09/11/2019   Primary MD  Olive Bass, MD     Recommendations for primary care physician for things to follow:  -Aspirin has been held on discharge, resume in 2 weeks if CBC remains stable. -Patient will need outpatient GI follow-up for further work-up for presumed GI bleed, and colonoscopy as well , as per family patient never had screening colonoscopy . -Resume antihypertensive regimen if blood pressure started to increase.  -Patient will need outpatient vascular surgery referral for finding of 4.4 infrarenal abdominal aortic aneurysm on CT imaging  Admission Diagnosis  Pneumonia due to COVID-19 virus [U07.1, J12.82]  Discharge Diagnosis  Pneumonia due to COVID-19 virus [U07.1, J12.82]   Principal Problem:   GI bleed Active Problems:   Pneumonia due to COVID-19 virus   AKI (acute kidney injury) (HCC)   Essential hypertension   Diabetes mellitus type 2 in obese (HCC)   AAA (abdominal aortic aneurysm) (HCC)   DNR (do not resuscitate)   Acute blood loss anemia      Past Medical History:  Diagnosis Date  . DM type 2 (diabetes mellitus, type 2) (HCC)   . HTN (hypertension)   . Hyperlipemia   . Obesity     History reviewed. No pertinent surgical history.     History of present illness and  Hospital Course:     Kindly see H&P for history of present illness and admission details, please review complete Labs, Consult reports and Test reports for all details in brief  HPI  from the history and physical done on the day of admission 09/06/2019   HPI: Julian Medina is a 84 y.o. male without reported significant past medical history hypertension, hyperlipidemia, and diabetes mellitus type 2 presented initially to [facility] on 4/7 with reports  of weakness.  History is obtained mostly from review of records and in talks with the patient.  In triage patient had portably syncopized and was pale and unresponsive.  His daughter reported that he had said that he was ready to die for the last 2 years.  He was reported to not be on any blood thinners, but have reportedly been having black diarrhea for the last 5 days.  The patient was noted to be afebrile, pulse 123, respirations 44, blood pressure 93/50, and O2 saturation 89% on room air.  He was placed on 2 L of cannula oxygen to maintain O2 saturations.  Labs revealed WBC 10.7, hemoglobin 14.9, platelets 202, BUN 43, Cr 2.1, Lactic acid 5.9, procalcitonin 0.81, CRP 79. CXR revealed a multifocal pna. Covid pcr was positive. 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  CT scan of thee chest, abdomen, and pelvis revealed patchy bilateral opacities in the mid and lower lungs, aortic atherosclerosis with a 4.4 cm infrarenal renal abdominal aortic aneurysm.  He  has been treated with IV fluids, Protonix drip, Decadron, remdesivir.  Repeat labs on 4/8 revealed hemoglobin dropped down to 11.6, BUN 36, creatinine 1.6, and lactic acid was clearing.  Transfer had been requested for the possible need of GI consultative services. Patient denies any use of any NSAIDs because it messes up his stomach. He does take a daily aspirin.    Hospital Course    GI bleed, Acuteblood loss anemia:  - Patient presents with complaints of dark stools and weakness. Stool guaiacs were noted to be positive at the outside facility with initial hemoglobin 14.9->11.6. e elevated BUN suggest possible upper GI bleed. -Admitting physician discussed with GI, given stable hemoglobin and positive COVID-19 status, no work-up is currently indicated as long hemoglobin remained stable, further work-up would be indicated as an outpatient. -No recurrence of GI bleed during  hospital stay, patient had multiple bowel movements with normal colored stools, hemoglobin remained stable, it is 10.6 on discharge. -Hold aspirin on discharge, resume in 2 weeks if hemoglobin remained stable. -Treated With Protonix drip initially, he will be discharged on Protonix 40 mg oral twice daily. -Did not follow with GI physician in the past, never had endoscopy or colonoscopy, I have discussed with daughter importance of outpatient GI follow-up especially for screening colonoscopy after he recovers from Covid.  Acute respiratory failure with hypoxia secondary to pneumonia due to COVID-19: -Chest x-ray significant for multifocal opacity.  It was some oxygen requirement initially, but he remained on room air for last 3 days with no oxygen requirement, he was treated with steroids, and IV remdesivir, the 5 days of oral Decadron as an outpatient. (Patient with no further hypoxia, he had a few low readings, but this vital signs were checked and verified, most recent oxygen saturation is 98% on room air) -Elevated procalcitonin 0.8 at [facility] ED, treated with IV Rocephin and azithromycin during hospital stays patient with elevated lactic acid 5.9 on admission, to finish another 3 days of Augmentin as an outpatient .  Acute kidney injury:  - Patient baseline creatinine unknown. At the outside facility patient's creatinine was noted to be 1.9 with BUN 40. -Improving with IV fluids, resolved  ?COPD -Continue trilogyElliptia  Diabetes mellitus type 2: Home medications include Metformin 1000 mg twice daily. -Continue home regimen on discharge  Essential hypertension: - Patient had initially been noted to be hypotensive. Blood pressures currently maintained. Homeblood pressure medications include irbesartan-hydrochlorothiazide300-12.5mg  dailyand spironolactone 25 mg daily. -Hold blood pressure medications on discharge given acceptable blood pressure medicine during hospital stay  with no medications, this can be resumed as an outpatient blood pressure started to increase.  AAA: Patient noted to have a 4.4 infrarenal abdominal aortic aneurysm on CT imaging at outside facility. -Recommending outpatient follow-up with rep  Patient did express some suicidal ideation yesterday, where he told the nurse he wants to shoot himself, so his discharge was canceled yesterday, patient himself denies any suicidal thoughts or ideation, reports he has been saying same for a long time and he does not mean it, and he does not feel depressed or has any suicidal thoughts or ideations, he was kept on suicide precaution, he was seen by psychiatry, who cleared him for discharge.   Discharge Condition: Stable D/W daughter  Follow UP  Follow-up Information    Dough, Jaymes Graff, MD Follow up in 2 week(s).   Specialty: Family Medicine Contact information: [address] [place] [address] [phone]        [facility] Follow up.   Specialty: Home Health Services Why: Home Health Contact information: [address] [place] [address] [phone]             Discharge Instructions  and  Discharge Medications    Discharge Instructions    MyChart COVID-19 home monitoring program   Complete by: Sep 10, 2019    Is the patient willing to use the MyChart Mobile App for home monitoring?: Yes   Temperature monitoring   Complete by: Sep 10, 2019    After how many days would you like to receive a notification of this patient's flowsheet entries?: 1   Discharge instructions   Complete by: As directed    Follow with Primary MD Olive Bass, MD in 10 days   Get CBC, CMP, 2 view Chest X ray checked  by Primary MD next visit.    Activity: As tolerated with Full fall precautions use walker/cane & assistance as needed   Disposition Home    Diet: Heart Healthy / Carb modified , with feeding assistance and aspiration precautions.  For Heart  failure patients - Check your Weight same time everyday, if you gain over 2 pounds, or you develop in leg swelling, experience more shortness of breath or chest pain, call your Primary MD immediately. Follow Cardiac Low Salt Diet and 1.5 lit/day fluid restriction.   On your next visit with your primary care physician please Get Medicines reviewed and adjusted.   Please request your Prim.MD to go over all Hospital Tests and Procedure/Radiological results at the follow up, please get all Hospital records sent to your Prim MD by signing hospital release before you go home.   If you experience worsening of your admission symptoms, develop shortness of breath, life threatening emergency, suicidal or homicidal thoughts you must seek medical attention immediately by calling 911 or calling your MD immediately  if symptoms less severe.  You Must read complete instructions/literature along with all the possible adverse reactions/side effects for all the Medicines you take and that have been prescribed to you. Take any new Medicines after you have completely understood and accpet all the possible adverse reactions/side effects.   Do not drive, operating heavy machinery, perform activities at heights, swimming or participation in water activities or provide baby sitting services if your were admitted for syncope or siezures until you have seen by Primary MD or a Neurologist and advised to do so again.  Do not drive when taking Pain medications.    Do not take more than prescribed Pain, Sleep and Anxiety Medications  Special Instructions: If you have smoked or chewed Tobacco  in the last 2 yrs please stop smoking, stop any regular Alcohol  and or any Recreational drug use.  Wear Seat belts while driving.   Please note  You were cared for by a hospitalist during your hospital stay. If you have any questions about your discharge medications or the care you received while you were in the hospital after  you are discharged, you can call the unit and asked to speak with the hospitalist on call if the hospitalist that took care of you is not available. Once you are discharged, your primary care physician will handle any further medical issues. Please note that NO REFILLS for any discharge medications will be authorized once you are discharged, as it is imperative that you return to your primary care physician (or establish a relationship with a primary care physician if you do not have  one) for your aftercare needs so that they can reassess your need for medications and monitor your lab values.   Increase activity slowly   Complete by: As directed      Allergies as of 09/11/2019   No Known Allergies     Medication List    STOP taking these medications   aspirin EC 81 MG tablet   irbesartan-hydrochlorothiazide 300-12.5 MG tablet Commonly known as: AVALIDE   spironolactone 25 MG tablet Commonly known as: ALDACTONE     TAKE these medications   acetaminophen 325 MG tablet Commonly known as: TYLENOL Take 2 tablets (650 mg total) by mouth every 6 (six) hours as needed for mild pain or headache (fever >/= 101).   amoxicillin-clavulanate 875-125 MG tablet Commonly known as: Augmentin Take 1 tablet by mouth 2 (two) times daily for 3 days.   atorvastatin 80 MG tablet Commonly known as: LIPITOR Take 80 mg by mouth daily.   calcium carbonate 500 MG chewable tablet Commonly known as: TUMS - dosed in mg elemental calcium Chew 1 tablet (200 mg of elemental calcium total) by mouth 2 (two) times daily with a meal for 14 days.   dexamethasone 6 MG tablet Commonly known as: DECADRON Take 1 tablet (6 mg total) by mouth daily.   furosemide 20 MG tablet Commonly known as: Lasix Take 1 tablet (20 mg total) by mouth daily.   metFORMIN 500 MG 24 hr tablet Commonly known as: GLUCOPHAGE-XR Take 1,000 mg by mouth 2 (two) times daily.   pantoprazole 40 MG tablet Commonly known as: PROTONIX Take 1  tablet (40 mg total) by mouth 2 (two) times daily.   Trelegy Ellipta 100-62.5-25 MCG/INH Aepb Generic drug: Fluticasone-Umeclidin-Vilant Inhale 1 puff into the lungs daily.   Ventolin HFA 108 (90 Base) MCG/ACT inhaler Generic drug: albuterol Inhale 2 puffs into the lungs every 6 (six) hours as needed for wheezing or shortness of breath.         Diet and Activity recommendation: See Discharge Instructions above   Consults obtained -  None   Major procedures and Radiology Reports - PLEASE review detailed and final reports for all details, in brief -     DG Chest Port 1 View  Result Date: 09/07/2019 CLINICAL DATA:  COVID-19 positive EXAM: PORTABLE CHEST 1 VIEW COMPARISON:  September 05, 2019 FINDINGS: There is ill-defined opacity in the left mid lung and in each lower lung region with small left pleural effusion. No consolidation. There is cardiomegaly with pulmonary vascularity normal. No adenopathy. There is aortic atherosclerosis. No bone lesions. IMPRESSION: Ill-defined opacity in the left mid lung and both lung bases consistent with multifocal pneumonia. Overall increase in opacity compared to 2 days prior. Small left pleural effusion. Stable cardiac prominence. No adenopathy demonstrable. Aortic Atherosclerosis (ICD10-I70.0). Electronically Signed   By: Bretta BangWilliam  Woodruff III M.D.   On: 09/07/2019 09:51    Micro Results    No results found for this or any previous visit (from the past 240 hour(s)).     Today   Subjective:   Thurnell LoseJohn Rogacki today has no headache,no chest or abdominal pain,no new weakness tingling or numbness, feels much better wants to go home today.  He is eager about going home today, he denies any suicidal thoughts or ideations.  Objective:   Blood pressure (!) 145/85, pulse 100, temperature 97.9 F (36.6 C), temperature source Oral, resp. rate (!) 24, SpO2 98 %.   Intake/Output Summary (Last 24 hours) at 09/11/2019 1321 Last data filed  at 09/11/2019  1300 Gross per 24 hour  Intake 840 ml  Output 3252 ml  Net -2412 ml    Exam Awake Alert, Oriented X 3, No new F.N deficits, Normal affect Patient is legally blind. Symmetrical Chest wall movement, Good air movement bilaterally, CTAB RRR,No Gallops,Rubs or new Murmurs, No Parasternal Heave +ve B.Sounds, Abd Soft, No tenderness, No rebound - guarding or rigidity. No Cyanosis, Clubbing or edema, No new Rash or bruise    Data Review   CBC w Diff:  Lab Results  Component Value Date   WBC 6.7 09/11/2019   HGB 11.6 (L) 09/11/2019   HCT 34.8 (L) 09/11/2019   PLT 147 (L) 09/11/2019   LYMPHOPCT 10 09/10/2019   MONOPCT 7 09/10/2019   EOSPCT 0 09/10/2019   BASOPCT 0 09/10/2019    CMP:  Lab Results  Component Value Date   NA 143 09/11/2019   K 3.4 (L) 09/11/2019   CL 112 (H) 09/11/2019   CO2 21 (L) 09/11/2019   BUN 21 09/11/2019   CREATININE 1.13 09/11/2019   PROT 3.8 (L) 09/10/2019   ALBUMIN 1.7 (L) 09/10/2019   BILITOT 0.6 09/10/2019   ALKPHOS 36 (L) 09/10/2019   AST 26 09/10/2019   ALT 20 09/10/2019  .   Total Time in preparing paper work, data evaluation and todays exam - 35 minutes  Huey Bienenstock M.D on 09/11/2019 at 1:21 PM  [facility]   Office  [phone]

## 2019-11-05 DIAGNOSIS — I361 Nonrheumatic tricuspid (valve) insufficiency: Secondary | ICD-10-CM

## 2019-11-05 DIAGNOSIS — I351 Nonrheumatic aortic (valve) insufficiency: Secondary | ICD-10-CM

## 2019-11-05 DIAGNOSIS — I5033 Acute on chronic diastolic (congestive) heart failure: Secondary | ICD-10-CM | POA: Diagnosis not present

## 2019-11-06 DIAGNOSIS — I5033 Acute on chronic diastolic (congestive) heart failure: Secondary | ICD-10-CM | POA: Diagnosis not present

## 2019-11-07 DIAGNOSIS — I5033 Acute on chronic diastolic (congestive) heart failure: Secondary | ICD-10-CM | POA: Diagnosis not present

## 2019-11-08 DIAGNOSIS — I5033 Acute on chronic diastolic (congestive) heart failure: Secondary | ICD-10-CM | POA: Diagnosis not present

## 2019-11-08 DIAGNOSIS — I5043 Acute on chronic combined systolic (congestive) and diastolic (congestive) heart failure: Secondary | ICD-10-CM | POA: Diagnosis not present

## 2020-10-29 DEATH — deceased

## 2021-04-25 IMAGING — DX DG CHEST 1V PORT
1 series · 1 of 1 positions shown · non-contrast
Comparison: September 05, 2019

CLINICAL DATA: SPEM2-QG positive

EXAM:
PORTABLE CHEST 1 VIEW

[chest]
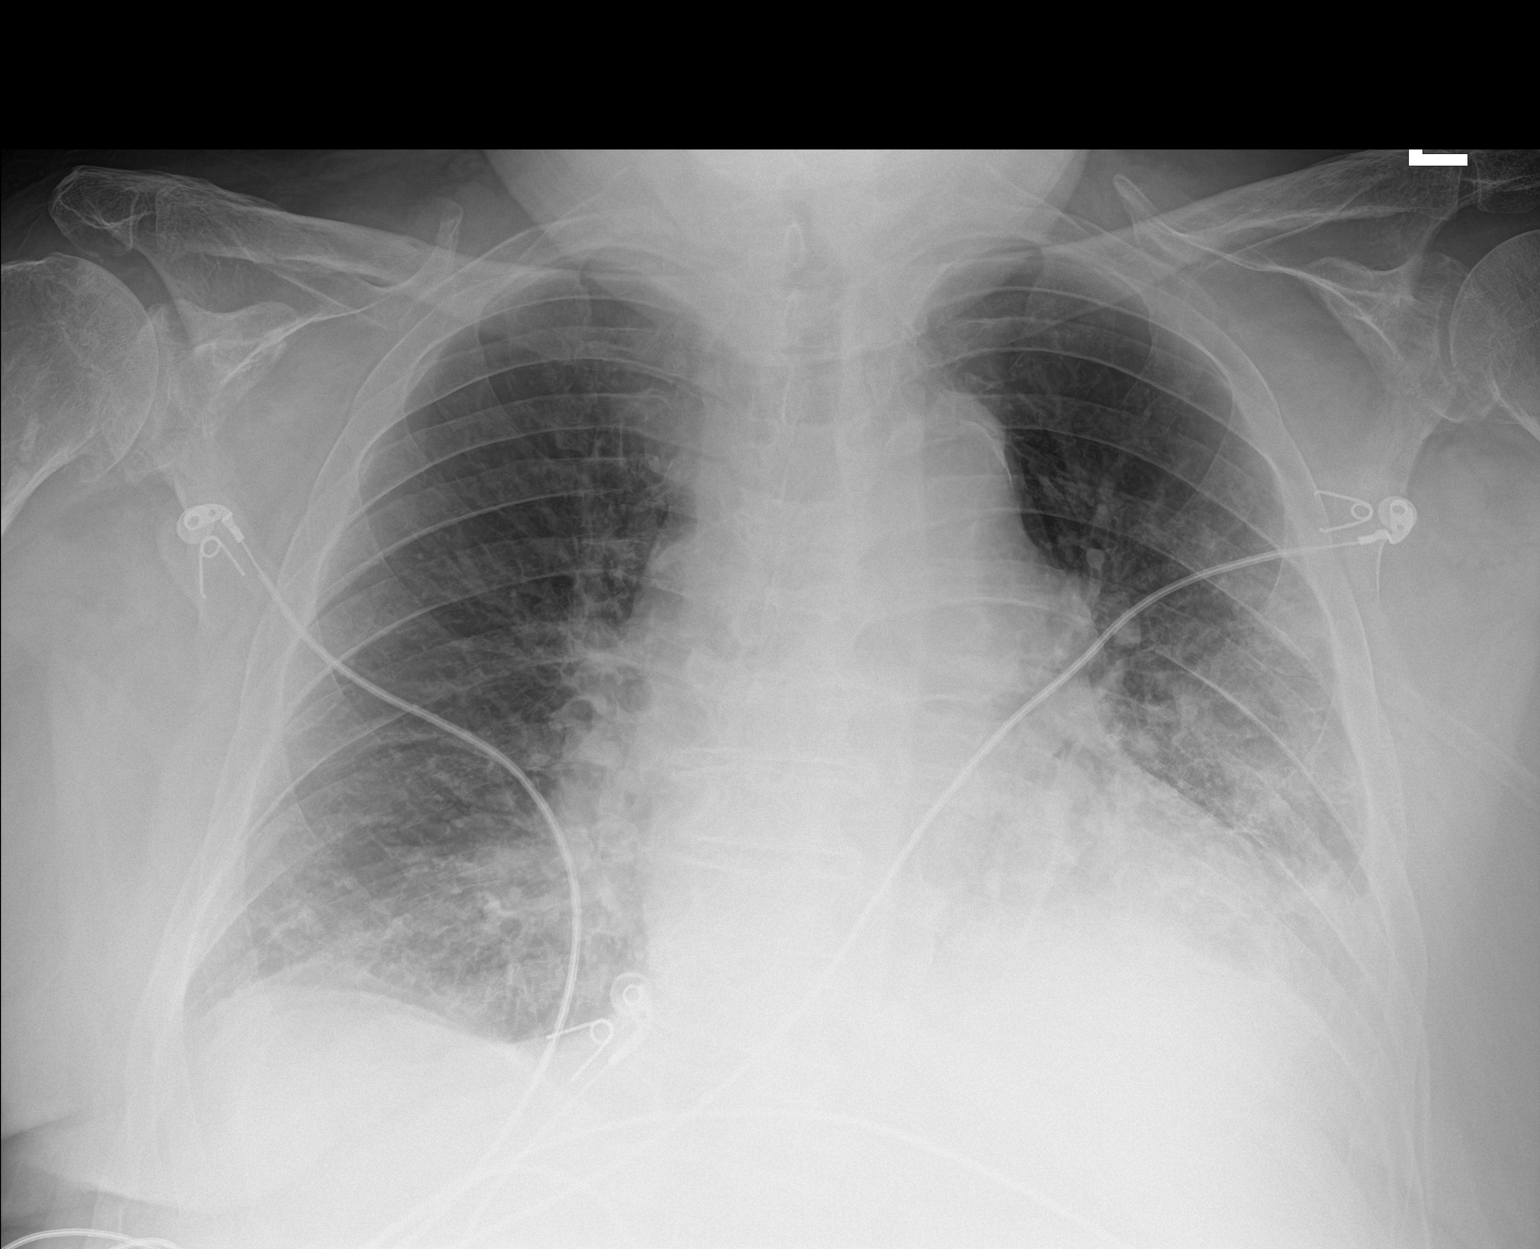

[1 of 1 positions shown; findings below may reference images not displayed]

FINDINGS: There is ill-defined opacity in the left mid lung and in each lower
lung region with small left pleural effusion. No consolidation.
There is cardiomegaly with pulmonary vascularity normal. No
adenopathy. There is aortic atherosclerosis. No bone lesions.
IMPRESSION: Ill-defined opacity in the left mid lung and both lung bases
consistent with multifocal pneumonia. Overall increase in opacity
compared to 2 days prior. Small left pleural effusion. Stable
cardiac prominence. No adenopathy demonstrable. Aortic
Atherosclerosis ([id number]-[id number].R).
